# Patient Record
Sex: Female | Born: 1985 | Race: Black or African American | Hispanic: No | Marital: Single | Smoking: Former smoker
Health system: Southern US, Community
[De-identification: ages and names within clinical notes are randomized; demographics above are authoritative.]

## PROBLEM LIST (undated history)

## (undated) DIAGNOSIS — R1310 Dysphagia, unspecified: Secondary | ICD-10-CM

## (undated) DIAGNOSIS — Z923 Personal history of irradiation: Secondary | ICD-10-CM

## (undated) DIAGNOSIS — Z87898 Personal history of other specified conditions: Secondary | ICD-10-CM

## (undated) DIAGNOSIS — N390 Urinary tract infection, site not specified: Secondary | ICD-10-CM

## (undated) DIAGNOSIS — Z8489 Family history of other specified conditions: Secondary | ICD-10-CM

## (undated) DIAGNOSIS — R198 Other specified symptoms and signs involving the digestive system and abdomen: Secondary | ICD-10-CM

## (undated) DIAGNOSIS — L910 Hypertrophic scar: Secondary | ICD-10-CM

## (undated) DIAGNOSIS — L309 Dermatitis, unspecified: Secondary | ICD-10-CM

## (undated) HISTORY — PX: KELOID EXCISION: SHX1856

## (undated) HISTORY — DX: Personal history of irradiation: Z92.3

## (undated) SURGERY — EXCISION MASS
Anesthesia: General | Laterality: Bilateral

---
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
## 2006-01-05 ENCOUNTER — Emergency Department (HOSPITAL_COMMUNITY): Admission: EM | Admit: 2006-01-05 | Discharge: 2006-01-05 | Payer: Self-pay | Admitting: Emergency Medicine

## 2006-03-25 ENCOUNTER — Emergency Department (HOSPITAL_COMMUNITY): Admission: EM | Admit: 2006-03-25 | Discharge: 2006-03-25 | Payer: Self-pay | Admitting: Emergency Medicine

## 2006-08-14 ENCOUNTER — Emergency Department (HOSPITAL_COMMUNITY): Admission: EM | Admit: 2006-08-14 | Discharge: 2006-08-14 | Payer: Self-pay | Admitting: Emergency Medicine

## 2007-07-24 ENCOUNTER — Emergency Department (HOSPITAL_COMMUNITY): Admission: EM | Admit: 2007-07-24 | Discharge: 2007-07-24 | Payer: Self-pay | Admitting: Emergency Medicine

## 2007-08-08 ENCOUNTER — Encounter: Admission: RE | Admit: 2007-08-08 | Discharge: 2007-08-08 | Payer: Self-pay | Admitting: Internal Medicine

## 2008-01-09 ENCOUNTER — Ambulatory Visit (HOSPITAL_COMMUNITY): Admission: RE | Admit: 2008-01-09 | Discharge: 2008-01-09 | Payer: Self-pay | Admitting: Obstetrics & Gynecology

## 2008-10-20 ENCOUNTER — Emergency Department (HOSPITAL_COMMUNITY): Admission: EM | Admit: 2008-10-20 | Discharge: 2008-10-21 | Payer: Self-pay | Admitting: Emergency Medicine

## 2010-02-10 ENCOUNTER — Emergency Department (HOSPITAL_COMMUNITY): Admission: EM | Admit: 2010-02-10 | Discharge: 2010-02-10 | Payer: Self-pay | Admitting: Emergency Medicine

## 2010-11-09 LAB — URINALYSIS, ROUTINE W REFLEX MICROSCOPIC
Hgb urine dipstick: NEGATIVE
Ketones, ur: NEGATIVE mg/dL
Specific Gravity, Urine: 1.011 (ref 1.005–1.030)
Urobilinogen, UA: 0.2 mg/dL (ref 0.0–1.0)
pH: 6.5 (ref 5.0–8.0)

## 2010-11-09 LAB — POCT PREGNANCY, URINE: Preg Test, Ur: NEGATIVE

## 2010-11-25 ENCOUNTER — Inpatient Hospital Stay (HOSPITAL_COMMUNITY)
Admission: AD | Admit: 2010-11-25 | Discharge: 2010-11-25 | Disposition: A | Discharge status: Home / Self Care | Payer: Self-pay | Source: Ambulatory Visit | Attending: Family Medicine | Admitting: Family Medicine

## 2010-11-25 DIAGNOSIS — N390 Urinary tract infection, site not specified: Secondary | ICD-10-CM | POA: Insufficient documentation

## 2010-11-25 DIAGNOSIS — R300 Dysuria: Secondary | ICD-10-CM | POA: Insufficient documentation

## 2010-11-25 LAB — URINALYSIS, ROUTINE W REFLEX MICROSCOPIC
Bilirubin Urine: NEGATIVE
Glucose, UA: NEGATIVE mg/dL
Nitrite: POSITIVE — AB
Protein, ur: >300 mg/dL — AB
pH: 6.5 (ref 5.0–8.0)

## 2010-11-25 LAB — POCT PREGNANCY, URINE: Preg Test, Ur: NEGATIVE

## 2010-11-25 LAB — URINE MICROSCOPIC-ADD ON

## 2010-11-30 ENCOUNTER — Inpatient Hospital Stay (HOSPITAL_COMMUNITY)
Admission: AD | Admit: 2010-11-30 | Discharge: 2010-11-30 | Disposition: A | Discharge status: Home / Self Care | Payer: Self-pay | Source: Ambulatory Visit | Attending: Obstetrics & Gynecology | Admitting: Obstetrics & Gynecology

## 2010-11-30 DIAGNOSIS — N390 Urinary tract infection, site not specified: Secondary | ICD-10-CM | POA: Insufficient documentation

## 2010-11-30 LAB — DIFFERENTIAL
Eosinophils Absolute: 0 10*3/uL (ref 0.0–0.7)
Lymphocytes Relative: 11 % — ABNORMAL LOW (ref 12–46)
Lymphs Abs: 1.8 10*3/uL (ref 0.7–4.0)

## 2010-11-30 LAB — CBC
Hemoglobin: 11.4 g/dL — ABNORMAL LOW (ref 12.0–15.0)
MCH: 27.9 pg (ref 26.0–34.0)
MCHC: 31.8 g/dL (ref 30.0–36.0)
Platelets: 368 10*3/uL (ref 150–400)
RDW: 13.2 % (ref 11.5–15.5)
WBC: 16.1 10*3/uL — ABNORMAL HIGH (ref 4.0–10.5)

## 2010-11-30 LAB — URINE MICROSCOPIC-ADD ON

## 2010-11-30 LAB — URINALYSIS, ROUTINE W REFLEX MICROSCOPIC
Ketones, ur: NEGATIVE mg/dL
Protein, ur: 30 mg/dL — AB
Specific Gravity, Urine: 1.015 (ref 1.005–1.030)

## 2012-03-16 ENCOUNTER — Encounter (HOSPITAL_COMMUNITY): Payer: Self-pay | Admitting: Emergency Medicine

## 2012-03-16 ENCOUNTER — Emergency Department (HOSPITAL_COMMUNITY)
Admission: EM | Admit: 2012-03-16 | Discharge: 2012-03-16 | Disposition: A | Discharge status: Home / Self Care | Payer: BC Managed Care – PPO | Attending: Emergency Medicine | Admitting: Emergency Medicine

## 2012-03-16 DIAGNOSIS — L02213 Cutaneous abscess of chest wall: Secondary | ICD-10-CM

## 2012-03-16 DIAGNOSIS — N61 Mastitis without abscess: Secondary | ICD-10-CM | POA: Insufficient documentation

## 2012-03-16 DIAGNOSIS — L0291 Cutaneous abscess, unspecified: Secondary | ICD-10-CM

## 2012-03-16 DIAGNOSIS — N611 Abscess of the breast and nipple: Secondary | ICD-10-CM | POA: Insufficient documentation

## 2012-03-16 DIAGNOSIS — F17200 Nicotine dependence, unspecified, uncomplicated: Secondary | ICD-10-CM | POA: Insufficient documentation

## 2012-03-16 DIAGNOSIS — N3090 Cystitis, unspecified without hematuria: Secondary | ICD-10-CM | POA: Insufficient documentation

## 2012-03-16 MED ORDER — OXYCODONE-ACETAMINOPHEN 5-325 MG PO TABS: 1.0000 | ORAL_TABLET | Freq: Four times a day (QID) | ORAL | Status: AC | PRN

## 2012-03-16 MED ORDER — LIDOCAINE HCL 2 % IJ SOLN
15.0000 mL | Freq: Once | INTRAMUSCULAR | Status: AC
  Administered 2012-03-16: 300 mg

## 2012-03-16 MED ORDER — HYDROMORPHONE HCL PF 1 MG/ML IJ SOLN
1.0000 mg | Freq: Once | INTRAMUSCULAR | Status: AC
  Administered 2012-03-16: 1 mg via INTRAVENOUS
  Filled 2012-03-16: qty 1

## 2012-03-16 MED ORDER — CLINDAMYCIN HCL 150 MG PO CAPS: 300.0000 mg | ORAL_CAPSULE | Freq: Three times a day (TID) | ORAL | Status: AC

## 2012-03-16 MED ORDER — CLINDAMYCIN PHOSPHATE 600 MG/50ML IV SOLN
600.0000 mg | Freq: Once | INTRAVENOUS | Status: AC
  Administered 2012-03-16: 600 mg via INTRAVENOUS
  Filled 2012-03-16: qty 50

## 2012-03-16 NOTE — ED Notes (Signed)
Prescriptions x2 given with discharge instructions.  

## 2012-03-16 NOTE — ED Notes (Signed)
Patient complaining of a boil on her right breast/chest area that started a week ago; patient states that she frequently gets boils, but that they usually resolve on their own.  Patient states that the abscess has not stopped draining all day.

## 2012-03-16 NOTE — ED Notes (Signed)
The pt  Has had a boil on her chest since last Saturday and she has had pain intermittently.  The boil started draining this am and has drained all day.  The pain is increasing at present

## 2012-03-16 NOTE — ED Notes (Signed)
Suture cart at bedside for I&D 

## 2012-03-16 NOTE — ED Provider Notes (Signed)
History     CSN: [id number]  Arrival date & time 03/16/12  0102   First MD Initiated Contact with Patient 03/16/12 0143      Chief Complaint  Patient presents with  . Abscess    (Consider location/radiation/quality/duration/timing/severity/associated sxs/prior treatment) HPI Comments: Sue Bailey presents for evaluation of a cystic mass on her right chest/breast.  She states she developed a small "knot" about a week ago that has slowly increased in size.  She states she has had similar eruptions in the past that have drained without intervention and then self resolved.  This one has only gotten larger and now in the last 24 hours has developed redness and blistering of the overlying skin.  She denies any fevers or other current skin issues.  She reports there has been a small steady output of purulent material also over the last 24 hours.  Patient is a 26 y.o. female presenting with abscess. The history is provided by the patient. No language interpreter was used.  Abscess  This is a recurrent problem. The current episode started less than one week ago. The problem has been gradually worsening. The abscess is present on the torso. The problem is severe. The abscess is characterized by redness, painfulness, draining, swelling and blistering. Associated with: no known exposures. The abscess first occurred at home. Pertinent negatives include no anorexia, no decrease in physical activity, no fever, no diarrhea, no vomiting, no congestion, no rhinorrhea, no sore throat and no cough. There were no sick contacts. She has received no recent medical care.    Past Medical History  Diagnosis Date  . Abscess   . Bladder infection     Past Surgical History  Procedure Date  . Keloid removal     History reviewed. No pertinent family history.  History  Substance Use Topics  . Smoking status: Current Some Day Smoker  . Smokeless tobacco: Not on file  . Alcohol Use: Yes    OB History    Grav Para Term Preterm Abortions TAB SAB Ect Mult Living                  Review of Systems  Constitutional: Negative for fever, chills, diaphoresis, activity change and fatigue.  HENT: Negative for congestion, sore throat, facial swelling, rhinorrhea, neck pain and neck stiffness.   Eyes: Negative.   Respiratory: Negative for cough, choking, chest tightness and shortness of breath.   Cardiovascular: Negative for chest pain, palpitations and leg swelling.  Gastrointestinal: Negative for vomiting, abdominal pain, diarrhea, blood in stool, abdominal distention, anal bleeding and anorexia.  Genitourinary: Negative.   Musculoskeletal: Negative.   Skin: Positive for color change and wound. Negative for pallor.       Pain is localized to the area of swelling.  Pt reports a hx of keloid formation.  Neurological: Negative.   Hematological: Negative for adenopathy. Does not bruise/bleed easily.  Psychiatric/Behavioral: Negative.     Allergies  Review of patient's allergies indicates no known allergies.  Home Medications   Current Outpatient Rx  Name Route Sig Dispense Refill  . DIPHENHYDRAMINE HCL 25 MG PO TABS Oral Take 50 mg by mouth every 6 (six) hours as needed. For itching    . IBUPROFEN 200 MG PO TABS Oral Take 200 mg by mouth every 6 (six) hours as needed. For pain    . TRIAMCINOLONE ACETONIDE 0.1 % EX OINT Topical Apply 1 application topically 2 (two) times daily.      BP 110/70  Pulse 88  Temp 98.3 F (36.8 C) (Oral)  Resp 16  SpO2 98%  LMP 02/25/2012  Physical Exam  Constitutional: She is oriented to person, place, and time. She appears well-developed and well-nourished. No distress.  HENT:  Head: Normocephalic and atraumatic.  Right Ear: External ear normal.  Left Ear: External ear normal.  Nose: Nose normal.  Mouth/Throat: Oropharynx is clear and moist. No oropharyngeal exudate.  Eyes: Conjunctivae and EOM are normal. Pupils are equal, round, and reactive to light.  Right eye exhibits no discharge. Left eye exhibits no discharge. No scleral icterus.  Neck: Normal range of motion. Neck supple. No JVD present. No tracheal deviation present. No thyromegaly present.  Cardiovascular: Normal rate, regular rhythm, normal heart sounds and intact distal pulses.  Exam reveals no gallop and no friction rub.   No murmur heard. Pulmonary/Chest: Effort normal and breath sounds normal. No stridor. No respiratory distress. She has no wheezes. She has no rales. She exhibits mass, tenderness and swelling. She exhibits no bony tenderness, no laceration, no crepitus and no edema. Right breast exhibits mass, skin change and tenderness. Right breast exhibits no inverted nipple and no nipple discharge.    Abdominal: Soft. Bowel sounds are normal. She exhibits no distension and no mass. There is no tenderness. There is no rebound and no guarding.  Musculoskeletal: Normal range of motion. She exhibits no edema and no tenderness.  Lymphadenopathy:    She has no cervical adenopathy.  Neurological: She is alert and oriented to person, place, and time.  Skin: Skin is warm and dry. She is not diaphoretic. There is erythema. No pallor.  Psychiatric: She has a normal mood and affect. Her behavior is normal. Judgment and thought content normal.    ED Course  INCISION AND DRAINAGE Date/Time: 03/16/2012 4:32 AM Performed by: Sue Bailey Authorized by: Sue Bailey Consent: Verbal consent obtained. Written consent not obtained. Risks and benefits: risks, benefits and alternatives were discussed Consent given by: patient Patient understanding: patient states understanding of the procedure being performed Patient consent: the patient's understanding of the procedure matches consent given Procedure consent: procedure consent matches procedure scheduled Site marked: the operative site was marked Patient identity confirmed: verbally with patient and arm band Type: abscess Body  area: trunk (right lower chest wall/medial breast) Location details: right breast Anesthesia: local infiltration Local anesthetic: lidocaine 2% without epinephrine Patient sedated: no Scalpel size: 11 Needle gauge: 22 Incision type: single straight Complexity: complex Drainage: purulent and bloody Drainage amount: moderate Wound treatment: wound left open Packing material: none Patient tolerance: Patient tolerated the procedure well with no immediate complications. Comments: Performed I&D.  2.5cm incision made following the natural skin fold immediately inferior to the area of fluctuance.  Moderate amt of pus and bloody purulent debris expressed.  Blunt probe used to breakdown loculations.  Secondary deeper incision made through initial incision site and extended superiorly (lateral or parallel to the chest wall.  Again pocket probed to breakdown loculations and purulent drainage observed.  Note significant underlying induration even after completion of the procedure.   (including critical care time)  Labs Reviewed - No data to display No results found.   No diagnosis found.    MDM  Pt presents for evaluation of a chestwall/breast lesion that appears consistent with an abscess.  She appears nontoxic but has significant localized discomfort.  Concerned that she is at risk for a poor cosmetic outcome because of the lesions size, location, and her hx of keloid  formation.  Will place an IV, provide symptomatic care, and contact the on-call general surgeon prior to performing an I&D.  0435.  Pt is stable, NAD.  Procedure was well tolerated.  Pt advised to return to the emergency department in 24 hours for a wound check if the swelling does not start to improve or if it appears to expand.  She received IV clindamycin in the ER and will go home on PO clindamycin.  Discussed her presentation with Dr. Donell Beers.  She will see her in f/u.      Tobin Chad, MD 03/16/12 [phone]

## 2012-03-17 ENCOUNTER — Telehealth (INDEPENDENT_AMBULATORY_CARE_PROVIDER_SITE_OTHER): Payer: Self-pay

## 2012-03-17 ENCOUNTER — Emergency Department (INDEPENDENT_AMBULATORY_CARE_PROVIDER_SITE_OTHER)
Admission: EM | Admit: 2012-03-17 | Discharge: 2012-03-17 | Disposition: A | Discharge status: Home / Self Care | Payer: BC Managed Care – PPO | Source: Home / Self Care | Attending: Family Medicine | Admitting: Family Medicine

## 2012-03-17 ENCOUNTER — Encounter (HOSPITAL_COMMUNITY): Payer: Self-pay | Admitting: Emergency Medicine

## 2012-03-17 DIAGNOSIS — L02219 Cutaneous abscess of trunk, unspecified: Secondary | ICD-10-CM

## 2012-03-17 DIAGNOSIS — L02213 Cutaneous abscess of chest wall: Secondary | ICD-10-CM

## 2012-03-17 NOTE — ED Notes (Signed)
Seen in [facility] Saturday for abscess.  Returned to [facility] for follow up evaluation.  Reports less painful.

## 2012-03-17 NOTE — ED Provider Notes (Signed)
History     CSN: [id number]  Arrival date & time 03/17/12  1506   First MD Initiated Contact with Patient 03/17/12 1612      Chief Complaint  Patient presents with  . Follow-up    (Consider location/radiation/quality/duration/timing/severity/associated sxs/prior treatment) HPI Comments: 26 year old obese female nondiabetic smoker. Here for followup of an abscess in her right upper chest status post incision and drainage at [facility] yesterday. She received IV clindamycin and is currently taking clindamycin orally. Reports decreased pain and swelling. No drainage. Here for wound check and followup. Denies fever or chills. No wound cultures pending.    Past Medical History  Diagnosis Date  . Abscess   . Bladder infection     Past Surgical History  Procedure Date  . Keloid removal     No family history on file.  History  Substance Use Topics  . Smoking status: Current Some Day Smoker  . Smokeless tobacco: Not on file  . Alcohol Use: Yes    OB History    Grav Para Term Preterm Abortions TAB SAB Ect Mult Living                  Review of Systems  Constitutional: Negative for fever and chills.       10 systems reviewed and  pertinent negative and positive symptoms are as per HPI.     Skin:       As per HPI  All other systems reviewed and are negative.    Allergies  Review of patient's allergies indicates no known allergies.  Home Medications   Current Outpatient Rx  Name Route Sig Dispense Refill  . CLINDAMYCIN HCL 150 MG PO CAPS Oral Take 2 capsules (300 mg total) by mouth 3 (three) times daily. May dispense as 150mg  capsules 60 capsule 0  . DIPHENHYDRAMINE HCL 25 MG PO TABS Oral Take 50 mg by mouth every 6 (six) hours as needed. For itching    . IBUPROFEN 200 MG PO TABS Oral Take 200 mg by mouth every 6 (six) hours as needed. For pain    . OXYCODONE-ACETAMINOPHEN 5-325 MG PO TABS Oral Take 1 tablet by mouth every 6 (six) hours as needed for  pain. 16 tablet 0  . TRIAMCINOLONE ACETONIDE 0.1 % EX OINT Topical Apply 1 application topically 2 (two) times daily.      BP 105/72  Pulse 77  Temp 98 F (36.7 C) (Oral)  Resp 16  SpO2 99%  LMP 02/25/2012  Physical Exam  Nursing note and vitals reviewed. Constitutional: She is oriented to person, place, and time. She appears well-developed and well-nourished. No distress.  HENT:  Head: Normocephalic and atraumatic.  Neck: Neck supple.  Cardiovascular: Normal heart sounds.   Pulmonary/Chest: Breath sounds normal.  Lymphadenopathy:    She has no cervical adenopathy.  Neurological: She is alert and oriented to person, place, and time.  Skin:       Wound located in anterior chest medial and inferior to right beast. No significant erythema. Skin is peeling. Mild tenderness to palpation around wound. No fluctuation. There are 2 small incisions that appear to be close. With no drainage. No packing superior incision with small 5mm round lax fluctuant skin on top. Probed with an 18g needle after local infiltration and no purulent return. Impress early keloid formation.     ED Course  Procedures (including critical care time)  Labs Reviewed - No data to display No results found.   1. Abscess  of chest wall       MDM  Minimal expected discomfort with palpation, no active drainage. Incisions are closed there was an area where the skin appeared overstretched and fluctuant I probed with a 18 g needle after local infiltration with lidocaine no purulent return appears patient is forming a small keloid at the incision site. Asked to continue wound care as previously instructed and complete antibiotic therapy; return if recurrent pain, redness or drainage.     Sharin Grave, MD 03/18/12 [phone]

## 2012-03-17 NOTE — ED Notes (Signed)
Patient prefers not to put on gown

## 2012-03-17 NOTE — Telephone Encounter (Signed)
Patient was seen in ER for chest wall abscess and was to to follow up within 5 days with [facility]. No available spot. Please call patient with appointment.

## 2012-03-18 ENCOUNTER — Telehealth (INDEPENDENT_AMBULATORY_CARE_PROVIDER_SITE_OTHER): Payer: Self-pay

## 2012-03-18 NOTE — Telephone Encounter (Signed)
Notified pt of her f/u appt with Dr. Donell Beers on 03/24/12 at 9:45am.

## 2012-03-24 ENCOUNTER — Ambulatory Visit (INDEPENDENT_AMBULATORY_CARE_PROVIDER_SITE_OTHER): Payer: BC Managed Care – PPO | Admitting: General Surgery

## 2012-03-24 ENCOUNTER — Encounter (INDEPENDENT_AMBULATORY_CARE_PROVIDER_SITE_OTHER): Payer: Self-pay | Admitting: General Surgery

## 2012-03-24 VITALS — BP 130/80 | HR 60 | Temp 97.2°F | Resp 12 | Ht 66.0 in | Wt 245.8 lb

## 2012-03-24 DIAGNOSIS — N611 Abscess of the breast and nipple: Secondary | ICD-10-CM

## 2012-03-24 DIAGNOSIS — N61 Mastitis without abscess: Secondary | ICD-10-CM

## 2012-03-24 NOTE — Patient Instructions (Addendum)
Shower daily with dressing OFF.  Keep covered while draining.    Keep area clean.    Follow up in 2 weeks.

## 2012-03-24 NOTE — Progress Notes (Signed)
Chief Complaint  Patient presents with  . Routine Post Op    HISTORY: Pt is a 26 yo F with a 1.5 week history of a right medial breast abscess.  She was seen in the ED and had it lanced.  They did not pack it because it was too small.  She has been on clindamycin.  She is now having some soreness again.  It is no longer draining, but "has a head."  She is not having fever/ chills.    Past Medical History  Diagnosis Date  . Abscess   . Bladder infection     Past Surgical History  Procedure Date  . Keloid removal     Current Outpatient Prescriptions  Medication Sig Dispense Refill  . clindamycin (CLEOCIN) 150 MG capsule Take 2 capsules (300 mg total) by mouth 3 (three) times daily. May dispense as 150mg  capsules  60 capsule  0  . diphenhydrAMINE (BENADRYL) 25 MG tablet Take 50 mg by mouth every 6 (six) hours as needed. For itching      . ibuprofen (ADVIL,MOTRIN) 200 MG tablet Take 200 mg by mouth every 6 (six) hours as needed. For pain      . oxyCODONE-acetaminophen (PERCOCET) 5-325 MG per tablet Take 1 tablet by mouth every 6 (six) hours as needed for pain.  16 tablet  0  . triamcinolone ointment (KENALOG) 0.1 % Apply 1 application topically 2 (two) times daily.         Allergies  Allergen Reactions  . Penicillins Itching     Family History  Problem Relation Age of Onset  . Hyperlipidemia Father   . Cancer Paternal Grandmother     breast  . Diabetes Paternal Grandmother   . Cancer Paternal Grandfather     lung     History   Social History  . Marital Status: Single    Spouse Name: N/A    Number of Children: N/A  . Years of Education: N/A   Social History Main Topics  . Smoking status: Current Some Day Smoker  . Smokeless tobacco: Never Used  . Alcohol Use: Yes  . Drug Use: No  . Sexually Active: None    REVIEW OF SYSTEMS - PERTINENT POSITIVES ONLY: 12 point review of systems negative other than HPI and PMH except for headaches and easy bruising.    EXAM: Filed Vitals:   03/24/12 0956  BP: 130/80  Pulse: 60  Temp: 97.2 F (36.2 C)  Resp: 12    Gen:  No acute distress.  Well nourished and well groomed.   Neurological: Alert and oriented to person, place, and time. Coordination normal.  Head: Normocephalic and atraumatic.  Eyes: Conjunctivae are normal. Pupils are equal, round, and reactive to light. No scleral icterus.  Cardiovascular: Normal rate, regular rhythm. Respiratory: Effort normal.  No respiratory distress. Chest wall tenderness over medial right breast.  1 cm firm indurated area with boil on top.  This is anesthetized with 1% lidocaine and opened up.    GI: Soft. The abdomen is soft and nontender.  There is no rebound and no guarding.  Musculoskeletal: Normal range of motion. Extremities are nontender.  Lymphadenopathy: No cervical, preauricular, postauricular or axillary adenopathy is present Skin: Skin is warm and dry. No rash noted. No diaphoresis. No erythema. No pallor. No clubbing, cyanosis, or edema. Significant keloid formation over skin of chest and abdomen.    Psychiatric: Normal mood and affect. Behavior is normal. Judgment and thought content normal.  ASSESSMENT AND PLAN: right breast abscess Keep area clean.  Continue clindamycin.  Follow up in 2 weeks.  Too small to pack.       Maudry Diego MD Surgical Oncology, General and Endocrine Surgery [facility]      Visit Diagnoses: 1. right breast abscess     Primary Care Physician: Sheila Oats, MD

## 2012-03-24 NOTE — Assessment & Plan Note (Signed)
Keep area clean.  Continue clindamycin.  Follow up in 2 weeks.  Too small to pack.

## 2012-04-07 ENCOUNTER — Ambulatory Visit (INDEPENDENT_AMBULATORY_CARE_PROVIDER_SITE_OTHER): Payer: BC Managed Care – PPO | Admitting: General Surgery

## 2012-04-07 VITALS — BP 110/76 | HR 72 | Temp 97.6°F | Resp 16 | Ht 66.0 in | Wt 241.4 lb

## 2012-04-07 DIAGNOSIS — N611 Abscess of the breast and nipple: Secondary | ICD-10-CM

## 2012-04-07 DIAGNOSIS — N61 Mastitis without abscess: Secondary | ICD-10-CM

## 2012-04-07 MED ORDER — DOXYCYCLINE HYCLATE 100 MG PO TABS: 100.0000 mg | ORAL_TABLET | Freq: Two times a day (BID) | ORAL | Status: AC

## 2012-04-07 NOTE — Patient Instructions (Signed)
Get antibiotic and start if soreness, redness, or drainage starts again.  Follow up as needed.

## 2012-04-07 NOTE — Assessment & Plan Note (Signed)
Still minimally tender, but no evidence of cellulitis or abscess.  Follow up as needed. Give script for antibiotic to take if soreness starts before she can get appt.

## 2012-04-07 NOTE — Progress Notes (Signed)
HISTORY: Patient is a 26 year old female who is now approximately 2 weeks status post I&D of her right medial breast abscess. This was very small. She had it lanced in the emergency department, but it closed over and she developed a head at the top.  She is no longer having pain. It did close over again but she is not having soreness.  She denies fevers/ chills.    EXAM: General:  Alert and oriented Incision:  Already some keloid.  Tender only with deep palpation.      ASSESSMENT AND PLAN:   right breast abscess Still minimally tender, but no evidence of cellulitis or abscess.  Follow up as needed. Give script for antibiotic to take if soreness starts before she can get appt.        Maudry Diego, MD Surgical Oncology, General & Endocrine Surgery [facility]  DEFAULT,PROVIDER, MD Default, Provider, MD

## 2012-07-30 ENCOUNTER — Emergency Department (HOSPITAL_COMMUNITY)
Admission: EM | Admit: 2012-07-30 | Discharge: 2012-07-30 | Disposition: A | Discharge status: Home / Self Care | Payer: BC Managed Care – PPO | Attending: Emergency Medicine | Admitting: Emergency Medicine

## 2012-07-30 ENCOUNTER — Encounter (HOSPITAL_COMMUNITY): Payer: Self-pay | Admitting: *Deleted

## 2012-07-30 ENCOUNTER — Emergency Department (HOSPITAL_COMMUNITY): Payer: BC Managed Care – PPO

## 2012-07-30 DIAGNOSIS — Z79899 Other long term (current) drug therapy: Secondary | ICD-10-CM | POA: Insufficient documentation

## 2012-07-30 DIAGNOSIS — F17200 Nicotine dependence, unspecified, uncomplicated: Secondary | ICD-10-CM | POA: Insufficient documentation

## 2012-07-30 DIAGNOSIS — W010XXA Fall on same level from slipping, tripping and stumbling without subsequent striking against object, initial encounter: Secondary | ICD-10-CM | POA: Insufficient documentation

## 2012-07-30 DIAGNOSIS — Y939 Activity, unspecified: Secondary | ICD-10-CM | POA: Insufficient documentation

## 2012-07-30 DIAGNOSIS — S93609A Unspecified sprain of unspecified foot, initial encounter: Secondary | ICD-10-CM | POA: Insufficient documentation

## 2012-07-30 DIAGNOSIS — Y929 Unspecified place or not applicable: Secondary | ICD-10-CM | POA: Insufficient documentation

## 2012-07-30 MED ORDER — NAPROXEN 500 MG PO TABS: 500.0000 mg | ORAL_TABLET | Freq: Two times a day (BID) | ORAL | Status: DC

## 2012-07-30 MED ORDER — HYDROCODONE-ACETAMINOPHEN 5-325 MG PO TABS: 2.0000 | ORAL_TABLET | ORAL | Status: DC | PRN

## 2012-07-30 NOTE — ED Provider Notes (Signed)
History     CSN: [id number]  Arrival date & time 07/30/12  1252   First MD Initiated Contact with Patient 07/30/12 1423      Chief Complaint  Patient presents with  . Foot Injury    (Consider location/radiation/quality/duration/timing/severity/associated sxs/prior treatment) The history is provided by the patient and medical records.    Sue Bailey is a 27 y.o. female  with a hx of abscess and bladder infection presents to the Emergency Department complaining of acute, persistent, progressively worsening left foot pain onset yesterday afternoon.  Patient states she was walking a slit floor when she tripped and fell bending her foot backwards. She states that she does not have any pain in her ankle or knee however she does have pain in the top of her foot.  He states the pain is a bad that she is unable to walk. Associated symptoms include swelling, pain, gait disturbance.  Nothing makes it better and everything makes it worse.  Pt denies fever, chills, headache, neck pain, back pain, abdominal pain, chest pain, nausea, vomiting, diarrhea, hitting her head, loss of consciousness, numbness, tingling, weakness.     Past Medical History  Diagnosis Date  . Abscess   . Bladder infection     Past Surgical History  Procedure Date  . Keloid removal     Family History  Problem Relation Age of Onset  . Hyperlipidemia Father   . Cancer Paternal Grandmother     breast  . Diabetes Paternal Grandmother   . Cancer Paternal Grandfather     lung    History  Substance Use Topics  . Smoking status: Current Some Day Smoker  . Smokeless tobacco: Never Used  . Alcohol Use: Yes    OB History    Grav Para Term Preterm Abortions TAB SAB Ect Mult Living                  Review of Systems  HENT: Negative for neck pain and neck stiffness.   Respiratory: Negative for chest tightness.   Cardiovascular: Negative for chest pain.  Gastrointestinal: Negative for nausea, vomiting,  abdominal pain and diarrhea.  Musculoskeletal: Positive for joint swelling, arthralgias and gait problem. Negative for back pain.  Skin: Negative for wound.  Neurological: Negative for weakness, numbness and headaches.  All other systems reviewed and are negative.    Allergies  Penicillins  Home Medications   Current Outpatient Rx  Name  Route  Sig  Dispense  Refill  . DIPHENHYDRAMINE HCL 25 MG PO TABS   Oral   Take 50 mg by mouth every 6 (six) hours as needed. For itching         . HYDROCODONE-ACETAMINOPHEN 5-325 MG PO TABS   Oral   Take 2 tablets by mouth every 4 (four) hours as needed for pain.   6 tablet   0   . IBUPROFEN 200 MG PO TABS   Oral   Take 200 mg by mouth every 6 (six) hours as needed. For pain         . NAPROXEN 500 MG PO TABS   Oral   Take 1 tablet (500 mg total) by mouth 2 (two) times daily with a meal.   30 tablet   0   . TRIAMCINOLONE ACETONIDE 0.1 % EX OINT   Topical   Apply 1 application topically 2 (two) times daily.           BP 131/67  Pulse 95  Temp 98.1 F (36.7  C) (Oral)  Resp 16  SpO2 100%  LMP 07/14/2012  Physical Exam  Nursing note and vitals reviewed. Constitutional: She is oriented to person, place, and time. She appears well-developed and well-nourished. No distress.  HENT:  Head: Normocephalic and atraumatic.  Eyes: Conjunctivae normal are normal.  Neck: Normal range of motion and full passive range of motion without pain. No spinous process tenderness and no muscular tenderness present.       No tenderness to palpation of spinous processes or paraspinal muscles  Cardiovascular: Normal rate, regular rhythm, normal heart sounds and intact distal pulses.  Exam reveals no gallop and no friction rub.   No murmur heard.      Capillary refill less than 3 seconds  Pulmonary/Chest: Effort normal and breath sounds normal.  Musculoskeletal: She exhibits tenderness. She exhibits no edema.       ROM: Decreased range of motion  in the foot and ankle secondary to pain Minimal swelling noted top of the left foot, no ecchymosis noted No tenderness to palpation the spinous processes or paraspinal muscles of the T-spine and L-spine  Neurological: She is alert and oriented to person, place, and time. She exhibits normal muscle tone. Coordination normal.       Sensation to dull and sharp intact Strength 3/5 in the toes and ankle of the left foot secondary to pain and decreased effort on the patient's part, 5/5 of the left knee Unable to assess gait as patient refused to walk due to pain  Skin: Skin is warm and dry. She is not diaphoretic.  Psychiatric: She has a normal mood and affect.    ED Course  Procedures (including critical care time)  Labs Reviewed - No data to display Dg Foot Complete Left  07/30/2012  *RADIOLOGY REPORT*  Clinical Data: Foot injury.  Fell.  Pain.  LEFT FOOT - COMPLETE 3+ VIEW  Comparison: None.  Findings: There is no evidence for acute fracture or dislocation. No soft tissue foreign body or gas identified.  IMPRESSION: Negative exam.   Original Report Authenticated By: Norva Pavlov, M.D.      1. Foot sprain       MDM  Sue Bailey presents emergency department complaining of pain in the top of her foot began to fall.  Neurovascularly intact with good sensation and palpable pulses.  Exam limited by patient cooperation. Likely foot sprain, low suspicion for fracture.   X-ray without evidence of acute fracture subluxation or dislocation.  Will put patient in a postop shoe and given crutches for comfort. Will write for Vicodin for pain control and Naprosyn for anti-inflammatories. Discussed with the patient the need to rest, ice, compress and elevate. I've given her an orthopedic referral in case she does not improve in the next few days.  1. Medications: vicodin, naprosyn, usual home medications 2. Treatment: rest, drink plenty of fluids, RICE, use post-op shoe 3. Follow Up: Please  followup with your primary doctor for discussion of your diagnoses and further evaluation after today's visit; if you do not have a primary care doctor use the resource guide provided to find one; followup with Dr. Magnus Ivan if symptoms do not improve in 3-5 days         Dierdre Forth, PA-C 07/30/12 1453

## 2012-07-30 NOTE — Progress Notes (Signed)
Orthopedic Tech Progress Note Patient Details:  Sue Bailey [birth year] [id number] Post op shoe applied to Left LE with comfort to patient's liking. Crutches set to patient's height and comfort with use instructions.  Ortho Devices Type of Ortho Device: Crutches;Postop shoe/boot Ortho Device/Splint Location: Left LE Ortho Device/Splint Interventions: Application   Asia R Thompson 07/30/2012, 3:04 PM

## 2012-07-30 NOTE — ED Notes (Signed)
To ED for eval of left foot pain since falling last pm. Pulses palpable. Unable to bear weight due to pain.

## 2012-07-30 NOTE — ED Provider Notes (Signed)
Medical screening examination/treatment/procedure(s) were performed by non-physician practitioner and as supervising physician I was immediately available for consultation/collaboration.   Flint Melter, MD 07/30/12 [phone]

## 2014-02-07 ENCOUNTER — Emergency Department (HOSPITAL_COMMUNITY): Payer: BC Managed Care – PPO

## 2014-02-07 ENCOUNTER — Emergency Department (HOSPITAL_COMMUNITY)
Admission: EM | Admit: 2014-02-07 | Discharge: 2014-02-07 | Disposition: A | Discharge status: Home / Self Care | Payer: BC Managed Care – PPO | Attending: Emergency Medicine | Admitting: Emergency Medicine

## 2014-02-07 ENCOUNTER — Encounter (HOSPITAL_COMMUNITY): Payer: Self-pay | Admitting: Emergency Medicine

## 2014-02-07 ENCOUNTER — Other Ambulatory Visit: Payer: Self-pay

## 2014-02-07 DIAGNOSIS — Z87891 Personal history of nicotine dependence: Secondary | ICD-10-CM | POA: Insufficient documentation

## 2014-02-07 DIAGNOSIS — Z880 Allergy status to penicillin: Secondary | ICD-10-CM | POA: Insufficient documentation

## 2014-02-07 DIAGNOSIS — Y9229 Other specified public building as the place of occurrence of the external cause: Secondary | ICD-10-CM | POA: Insufficient documentation

## 2014-02-07 DIAGNOSIS — X30XXXA Exposure to excessive natural heat, initial encounter: Secondary | ICD-10-CM | POA: Insufficient documentation

## 2014-02-07 DIAGNOSIS — Y9389 Activity, other specified: Secondary | ICD-10-CM | POA: Insufficient documentation

## 2014-02-07 DIAGNOSIS — Z87448 Personal history of other diseases of urinary system: Secondary | ICD-10-CM | POA: Insufficient documentation

## 2014-02-07 DIAGNOSIS — Z3202 Encounter for pregnancy test, result negative: Secondary | ICD-10-CM | POA: Insufficient documentation

## 2014-02-07 DIAGNOSIS — Z91040 Latex allergy status: Secondary | ICD-10-CM | POA: Insufficient documentation

## 2014-02-07 DIAGNOSIS — T671XXA Heat syncope, initial encounter: Secondary | ICD-10-CM | POA: Insufficient documentation

## 2014-02-07 DIAGNOSIS — T671XXS Heat syncope, sequela: Secondary | ICD-10-CM

## 2014-02-07 DIAGNOSIS — Z872 Personal history of diseases of the skin and subcutaneous tissue: Secondary | ICD-10-CM | POA: Insufficient documentation

## 2014-02-07 LAB — BASIC METABOLIC PANEL
ANION GAP: 15 (ref 5–15)
BUN: 13 mg/dL (ref 6–23)
CALCIUM: 9.4 mg/dL (ref 8.4–10.5)
CHLORIDE: 97 meq/L (ref 96–112)
CO2: 24 mEq/L (ref 19–32)
CREATININE: 0.68 mg/dL (ref 0.50–1.10)
Glucose, Bld: 86 mg/dL (ref 70–99)
Potassium: 4.3 mEq/L (ref 3.7–5.3)
Sodium: 136 mEq/L — ABNORMAL LOW (ref 137–147)

## 2014-02-07 LAB — URINALYSIS, ROUTINE W REFLEX MICROSCOPIC
BILIRUBIN URINE: NEGATIVE
GLUCOSE, UA: NEGATIVE mg/dL
HGB URINE DIPSTICK: NEGATIVE
KETONES UR: NEGATIVE mg/dL
Leukocytes, UA: NEGATIVE
Nitrite: NEGATIVE
PROTEIN: NEGATIVE mg/dL
Specific Gravity, Urine: 1.018 (ref 1.005–1.030)
UROBILINOGEN UA: 1 mg/dL (ref 0.0–1.0)
pH: 5.5 (ref 5.0–8.0)

## 2014-02-07 LAB — CBC
HCT: 40.2 % (ref 36.0–46.0)
Hemoglobin: 12.9 g/dL (ref 12.0–15.0)
MCH: 27.9 pg (ref 26.0–34.0)
MCHC: 32.1 g/dL (ref 30.0–36.0)
MCV: 86.8 fL (ref 78.0–100.0)
PLATELETS: 450 10*3/uL — AB (ref 150–400)
RBC: 4.63 MIL/uL (ref 3.87–5.11)
RDW: 13.2 % (ref 11.5–15.5)
WBC: 13.5 10*3/uL — AB (ref 4.0–10.5)

## 2014-02-07 LAB — CBG MONITORING, ED
Glucose-Capillary: 66 mg/dL — ABNORMAL LOW (ref 70–99)
Glucose-Capillary: 75 mg/dL (ref 70–99)

## 2014-02-07 LAB — POC URINE PREG, ED: Preg Test, Ur: NEGATIVE

## 2014-02-07 NOTE — ED Provider Notes (Signed)
CSN: [id number]     Arrival date & time 02/07/14  1409 History   First MD Initiated Contact with Patient 02/07/14 1557     Chief Complaint  Patient presents with  . Loss of Consciousness     (Consider location/radiation/quality/duration/timing/severity/associated sxs/prior Treatment) HPI Comments: Pt states that she was at church today and got hot and went to the bathroom and he was found of the floor. Pt states that besides being tired she if feeling better at this time. Pt states that she had done this before. She states they have never found the reason for the symptoms. Pt states that she got dizzy before the episode started.  The history is provided by the patient. No language interpreter was used.    Past Medical History  Diagnosis Date  . Abscess   . Bladder infection    Past Surgical History  Procedure Laterality Date  . Keloid removal     Family History  Problem Relation Age of Onset  . Hyperlipidemia Father   . Cancer Paternal Grandmother     breast  . Diabetes Paternal Grandmother   . Cancer Paternal Grandfather     lung   History  Substance Use Topics  . Smoking status: Former Games developer  . Smokeless tobacco: Never Used  . Alcohol Use: Yes   OB History   Grav Para Term Preterm Abortions TAB SAB Ect Mult Living                 Review of Systems  Constitutional: Negative.   Respiratory: Negative.   Cardiovascular: Negative.   Neurological: Positive for dizziness.      Allergies  Bee venom; Penicillins; and Latex  Home Medications   Prior to Admission medications   Medication Sig Start Date End Date Taking? Authorizing Provider  clobetasol ointment (TEMOVATE) 0.05 % Apply 1 application topically 2 (two) times daily as needed (skin reactions).   Yes Historical Provider, MD  EPINEPHrine (EPIPEN) 0.3 mg/0.3 mL IJ SOAJ injection Inject 0.3 mg into the muscle as needed (allergic reaction).   Yes Historical Provider, MD  ibuprofen (ADVIL,MOTRIN) 800 MG  tablet Take 800 mg by mouth daily as needed for cramping.   Yes Historical Provider, MD   BP 110/69  Pulse 74  Temp(Src) 97.7 F (36.5 C)  Resp 18  Ht 5' 5.5" (1.664 m)  Wt 249 lb 4 oz (113.059 kg)  BMI 40.83 kg/m2  SpO2 100%  LMP 01/16/2014 Physical Exam  Nursing note and vitals reviewed. Constitutional: She is oriented to person, place, and time. She appears well-developed and well-nourished.  HENT:  Head: Normocephalic and atraumatic.  Cardiovascular: Normal rate and regular rhythm.   Pulmonary/Chest: Effort normal and breath sounds normal.  Abdominal: Soft. Bowel sounds are normal.  Musculoskeletal: Normal range of motion.       Cervical back: Normal.  Neurological: She is alert and oriented to person, place, and time. She exhibits normal muscle tone. Coordination normal.  Skin: Skin is warm and dry.    ED Course  Procedures (including critical care time) Labs Review Labs Reviewed  CBC - Abnormal; Notable for the following:    WBC 13.5 (*)    Platelets 450 (*)    All other components within normal limits  BASIC METABOLIC PANEL - Abnormal; Notable for the following:    Sodium 136 (*)    All other components within normal limits  CBG MONITORING, ED - Abnormal; Notable for the following:    Glucose-Capillary 66 (*)  All other components within normal limits  URINALYSIS, ROUTINE W REFLEX MICROSCOPIC  POC URINE PREG, ED  CBG MONITORING, ED    Imaging Review Dg Chest 2 View  02/07/2014   CLINICAL DATA:  Syncope.  EXAM: CHEST  2 VIEW  COMPARISON:  None.  FINDINGS: Heart size and mediastinal contours are within normal limits. Both lungs are clear. Visualized skeletal structures are unremarkable. Scoliosis is noted.  IMPRESSION: No acute disease.   Electronically Signed   By: Drusilla Kannerhomas  Dalessio M.D.   On: 02/07/2014 17:01     EKG Interpretation   Date/Time:  Sunday February 07 2014 14:19:44 EDT Ventricular Rate:  89 PR Interval:  134 QRS Duration: 74 QT Interval:   334 QTC Calculation: 406 R Axis:   35 Text Interpretation:  Normal sinus rhythm Normal ECG Confirmed by DELOS   MD, DOUGLAS ([id number]) on 02/07/2014 4:18:06 PM      MDM   Final diagnoses:  Heat syncope, sequela    Pt is ready to go home. No acute abnormality noted at this time. Pt can follow up with pcp    Teressa LowerVrinda Rocio Wolak, NP 02/07/14 1919

## 2014-02-07 NOTE — ED Notes (Signed)
Pt returned from scans. Pt monitored by 12-lead, bp cuff, and pulse ox. 

## 2014-02-07 NOTE — ED Notes (Signed)
Provided sandwich meal bag, graham crackers and a coke. Patient states she has not eaten today.

## 2014-02-07 NOTE — ED Notes (Signed)
CBG = 75. Nurse informed. 

## 2014-02-07 NOTE — ED Notes (Signed)
CBG = 66. Nurse informed.

## 2014-02-07 NOTE — ED Notes (Signed)
Patient transported to X-ray 

## 2014-02-07 NOTE — ED Provider Notes (Signed)
Medical screening examination/treatment/procedure(s) were performed by non-physician practitioner and as supervising physician I was immediately available for consultation/collaboration.   EKG Interpretation   Date/Time:  Sunday February 07 2014 14:19:44 EDT Ventricular Rate:  89 PR Interval:  134 QRS Duration: 74 QT Interval:  334 QTC Calculation: 406 R Axis:   35 Text Interpretation:  Normal sinus rhythm Normal ECG Confirmed by Malva CoganELOS   MD, Jaelah Hauth ([id number]) on 02/07/2014 4:18:06 PM       Geoffery Lyonsouglas Caitlyne Ingham, MD 02/07/14 [phone]

## 2014-02-07 NOTE — ED Notes (Signed)
Pt reports while at church today, members found her passed out on the bathroom floor. Pt reports that she felt herself get dizzy before passing out. Pt reports history of same, reports it happens when she gets too hot or too cold. Pt AAOx3 at present.

## 2014-07-30 HISTORY — PX: INCISION AND DRAINAGE ABSCESS: SHX5864

## 2016-03-28 ENCOUNTER — Ambulatory Visit: Payer: BLUE CROSS/BLUE SHIELD | Admitting: Radiation Oncology

## 2016-03-30 DIAGNOSIS — L910 Hypertrophic scar: Secondary | ICD-10-CM

## 2016-03-30 HISTORY — DX: Hypertrophic scar: L91.0
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
## 2016-04-03 ENCOUNTER — Institutional Professional Consult (permissible substitution): Payer: Self-pay | Admitting: Radiation Oncology

## 2016-04-03 ENCOUNTER — Ambulatory Visit
Admission: RE | Admit: 2016-04-03 | Discharge: 2016-04-03 | Disposition: A | Discharge status: Home / Self Care | Payer: BLUE CROSS/BLUE SHIELD | Source: Ambulatory Visit | Attending: Radiation Oncology | Admitting: Radiation Oncology

## 2016-04-03 ENCOUNTER — Encounter: Payer: Self-pay | Admitting: Radiation Oncology

## 2016-04-03 VITALS — BP 115/67 | HR 69 | Temp 98.2°F | Ht 65.5 in | Wt 242.1 lb

## 2016-04-03 DIAGNOSIS — Z510 Encounter for antineoplastic radiation therapy: Secondary | ICD-10-CM | POA: Insufficient documentation

## 2016-04-03 DIAGNOSIS — L910 Hypertrophic scar: Secondary | ICD-10-CM | POA: Insufficient documentation
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
## 2016-04-09 ENCOUNTER — Encounter (HOSPITAL_BASED_OUTPATIENT_CLINIC_OR_DEPARTMENT_OTHER): Payer: Self-pay | Admitting: *Deleted

## 2016-04-16 ENCOUNTER — Ambulatory Visit: Payer: BLUE CROSS/BLUE SHIELD | Admitting: Radiation Oncology

## 2016-04-16 ENCOUNTER — Ambulatory Visit (HOSPITAL_BASED_OUTPATIENT_CLINIC_OR_DEPARTMENT_OTHER): Admission: RE | Admit: 2016-04-16 | Payer: BLUE CROSS/BLUE SHIELD | Source: Ambulatory Visit | Admitting: Specialist

## 2016-04-16 HISTORY — DX: Other specified symptoms and signs involving the digestive system and abdomen: R19.8

## 2016-04-16 HISTORY — DX: Family history of other specified conditions: Z84.89

## 2016-04-16 HISTORY — DX: Personal history of other specified conditions: Z87.898

## 2016-04-16 HISTORY — DX: Urinary tract infection, site not specified: N39.0

## 2016-04-16 HISTORY — DX: Hypertrophic scar: L91.0

## 2016-04-16 HISTORY — DX: Dysphagia, unspecified: R13.10

## 2016-04-16 HISTORY — DX: Dermatitis, unspecified: L30.9

## 2016-04-16 SURGERY — EXCISION MASS
Anesthesia: General

## 2016-04-17 ENCOUNTER — Ambulatory Visit: Payer: BLUE CROSS/BLUE SHIELD | Admitting: Radiation Oncology

## 2016-04-18 ENCOUNTER — Ambulatory Visit: Payer: BLUE CROSS/BLUE SHIELD

## 2016-04-18 ENCOUNTER — Ambulatory Visit (HOSPITAL_BASED_OUTPATIENT_CLINIC_OR_DEPARTMENT_OTHER)
Admission: RE | Admit: 2016-04-18 | Discharge: 2016-04-18 | Disposition: A | Discharge status: Home / Self Care | Payer: BLUE CROSS/BLUE SHIELD | Source: Ambulatory Visit | Attending: Specialist | Admitting: Specialist

## 2016-04-18 ENCOUNTER — Ambulatory Visit: Payer: Self-pay | Admitting: Specialist

## 2016-04-18 ENCOUNTER — Encounter (HOSPITAL_BASED_OUTPATIENT_CLINIC_OR_DEPARTMENT_OTHER): Payer: Self-pay | Admitting: Anesthesiology

## 2016-04-18 ENCOUNTER — Ambulatory Visit (HOSPITAL_BASED_OUTPATIENT_CLINIC_OR_DEPARTMENT_OTHER): Payer: BLUE CROSS/BLUE SHIELD | Admitting: Anesthesiology

## 2016-04-18 ENCOUNTER — Encounter (HOSPITAL_BASED_OUTPATIENT_CLINIC_OR_DEPARTMENT_OTHER)
Admission: RE | Disposition: A | Discharge status: Home / Self Care | Payer: Self-pay | Source: Ambulatory Visit | Attending: Specialist

## 2016-04-18 ENCOUNTER — Ambulatory Visit
Admission: RE | Admit: 2016-04-18 | Discharge: 2016-04-18 | Disposition: A | Discharge status: Home / Self Care | Payer: BLUE CROSS/BLUE SHIELD | Source: Ambulatory Visit | Attending: Radiation Oncology | Admitting: Radiation Oncology

## 2016-04-18 DIAGNOSIS — L910 Hypertrophic scar: Secondary | ICD-10-CM

## 2016-04-18 DIAGNOSIS — Z510 Encounter for antineoplastic radiation therapy: Secondary | ICD-10-CM | POA: Diagnosis present

## 2016-04-18 DIAGNOSIS — Z87891 Personal history of nicotine dependence: Secondary | ICD-10-CM | POA: Diagnosis not present

## 2016-04-18 HISTORY — PX: MASS EXCISION: SHX2000

## 2016-04-18 SURGERY — EXCISION MASS
Anesthesia: General | Site: Back

## 2016-04-18 MED ORDER — MIDAZOLAM HCL 2 MG/2ML IJ SOLN
INTRAMUSCULAR | Status: AC
  Filled 2016-04-18: qty 2

## 2016-04-18 MED ORDER — SCOPOLAMINE 1 MG/3DAYS TD PT72: 1.0000 | MEDICATED_PATCH | Freq: Once | TRANSDERMAL | Status: DC | PRN

## 2016-04-18 MED ORDER — PROPOFOL 10 MG/ML IV BOLUS
INTRAVENOUS | Status: AC
  Filled 2016-04-18: qty 20

## 2016-04-18 MED ORDER — LIDOCAINE-EPINEPHRINE 1 %-1:100000 IJ SOLN
INTRAMUSCULAR | Status: AC
  Filled 2016-04-18: qty 1

## 2016-04-18 MED ORDER — CEFAZOLIN SODIUM-DEXTROSE 2-4 GM/100ML-% IV SOLN
INTRAVENOUS | Status: AC
  Filled 2016-04-18: qty 100

## 2016-04-18 MED ORDER — CHLORHEXIDINE GLUCONATE CLOTH 2 % EX PADS: 6.0000 | MEDICATED_PAD | Freq: Once | CUTANEOUS | Status: DC

## 2016-04-18 MED ORDER — ONDANSETRON HCL 4 MG/2ML IJ SOLN
INTRAMUSCULAR | Status: AC
  Filled 2016-04-18: qty 2

## 2016-04-18 MED ORDER — FENTANYL CITRATE (PF) 100 MCG/2ML IJ SOLN
INTRAMUSCULAR | Status: AC
  Filled 2016-04-18: qty 2

## 2016-04-18 MED ORDER — CEFAZOLIN SODIUM-DEXTROSE 2-4 GM/100ML-% IV SOLN: 2.0000 g | INTRAVENOUS | Status: DC

## 2016-04-18 MED ORDER — DEXAMETHASONE SODIUM PHOSPHATE 10 MG/ML IJ SOLN
INTRAMUSCULAR | Status: AC
  Filled 2016-04-18: qty 1

## 2016-04-18 MED ORDER — HYDROMORPHONE HCL 1 MG/ML IJ SOLN: 0.2500 mg | INTRAMUSCULAR | Status: DC | PRN

## 2016-04-18 MED ORDER — LIDOCAINE 2% (20 MG/ML) 5 ML SYRINGE
INTRAMUSCULAR | Status: AC
  Filled 2016-04-18: qty 5

## 2016-04-18 MED ORDER — GLYCOPYRROLATE 0.2 MG/ML IJ SOLN: 0.2000 mg | Freq: Once | INTRAMUSCULAR | Status: DC | PRN

## 2016-04-18 MED ORDER — ONDANSETRON HCL 4 MG/2ML IJ SOLN
INTRAMUSCULAR | Status: DC | PRN
  Administered 2016-04-18: 4 mg via INTRAVENOUS

## 2016-04-18 MED ORDER — MIDAZOLAM HCL 2 MG/2ML IJ SOLN: 1.0000 mg | INTRAMUSCULAR | Status: DC | PRN

## 2016-04-18 MED ORDER — SUCCINYLCHOLINE CHLORIDE 200 MG/10ML IV SOSY
PREFILLED_SYRINGE | INTRAVENOUS | Status: AC
  Filled 2016-04-18: qty 10

## 2016-04-18 MED ORDER — OXYCODONE HCL 5 MG PO TABS: 5.0000 mg | ORAL_TABLET | Freq: Once | ORAL | Status: DC | PRN

## 2016-04-18 MED ORDER — ONDANSETRON HCL 4 MG/2ML IJ SOLN: 4.0000 mg | Freq: Once | INTRAMUSCULAR | Status: DC | PRN

## 2016-04-18 MED ORDER — CEFAZOLIN SODIUM-DEXTROSE 2-3 GM-% IV SOLR
INTRAVENOUS | Status: DC | PRN
  Administered 2016-04-18: 2 g via INTRAVENOUS

## 2016-04-18 MED ORDER — OXYCODONE HCL 5 MG/5ML PO SOLN: 5.0000 mg | Freq: Once | ORAL | Status: DC | PRN

## 2016-04-18 MED ORDER — FENTANYL CITRATE (PF) 100 MCG/2ML IJ SOLN
INTRAMUSCULAR | Status: DC | PRN
  Administered 2016-04-18: 100 ug via INTRAVENOUS

## 2016-04-18 MED ORDER — MIDAZOLAM HCL 5 MG/5ML IJ SOLN
INTRAMUSCULAR | Status: DC | PRN
  Administered 2016-04-18: 2 mg via INTRAVENOUS

## 2016-04-18 MED ORDER — SUCCINYLCHOLINE CHLORIDE 20 MG/ML IJ SOLN
INTRAMUSCULAR | Status: DC | PRN
  Administered 2016-04-18: 50 mg via INTRAVENOUS

## 2016-04-18 MED ORDER — PROPOFOL 10 MG/ML IV BOLUS
INTRAVENOUS | Status: DC | PRN
  Administered 2016-04-18: 300 mg via INTRAVENOUS

## 2016-04-18 MED ORDER — DEXAMETHASONE SODIUM PHOSPHATE 4 MG/ML IJ SOLN
INTRAMUSCULAR | Status: DC | PRN
  Administered 2016-04-18: 10 mg via INTRAVENOUS

## 2016-04-18 MED ORDER — LIDOCAINE HCL (CARDIAC) 20 MG/ML IV SOLN
INTRAVENOUS | Status: DC | PRN
  Administered 2016-04-18: 30 mg via INTRAVENOUS

## 2016-04-18 MED ORDER — MEPERIDINE HCL 25 MG/ML IJ SOLN: 6.2500 mg | INTRAMUSCULAR | Status: DC | PRN

## 2016-04-18 MED ORDER — LACTATED RINGERS IV SOLN
INTRAVENOUS | Status: DC
  Administered 2016-04-18 (×2): via INTRAVENOUS

## 2016-04-18 MED ORDER — FENTANYL CITRATE (PF) 100 MCG/2ML IJ SOLN: 50.0000 ug | INTRAMUSCULAR | Status: DC | PRN

## 2016-04-18 MED ORDER — LIDOCAINE-EPINEPHRINE (PF) 1 %-1:200000 IJ SOLN
INTRAMUSCULAR | Status: DC | PRN
Start: 2016-04-18 — End: 2016-04-18
  Administered 2016-04-18: 20 mL

## 2016-04-18 SURGICAL SUPPLY — 68 items
APL SKNCLS STERI-STRIP NONHPOA (GAUZE/BANDAGES/DRESSINGS)
BALL CTTN LRG ABS STRL LF (GAUZE/BANDAGES/DRESSINGS)
BENZOIN TINCTURE PRP APPL 2/3 (GAUZE/BANDAGES/DRESSINGS) IMPLANT
BLADE HEX COATED 2.75 (ELECTRODE) IMPLANT
BLADE KNIFE PERSONA 10 (BLADE) ×3 IMPLANT
BLADE KNIFE PERSONA 15 (BLADE) ×3 IMPLANT
BNDG CMPR 9X4 STRL LF SNTH (GAUZE/BANDAGES/DRESSINGS) ×1
BNDG ESMARK 4X9 LF (GAUZE/BANDAGES/DRESSINGS) ×3 IMPLANT
CANISTER SUCT 1200ML W/VALVE (MISCELLANEOUS) IMPLANT
CLOSURE WOUND 1/2 X4 (GAUZE/BANDAGES/DRESSINGS)
COTTONBALL LRG STERILE PKG (GAUZE/BANDAGES/DRESSINGS) IMPLANT
COVER BACK TABLE 60X90IN (DRAPES) ×3 IMPLANT
COVER MAYO STAND STRL (DRAPES) ×3 IMPLANT
DRAPE LAPAROSCOPIC ABDOMINAL (DRAPES) IMPLANT
DRAPE U-SHAPE 76X120 STRL (DRAPES) IMPLANT
DRSG PAD ABDOMINAL 8X10 ST (GAUZE/BANDAGES/DRESSINGS) ×3 IMPLANT
ELECT NDL BLADE 2-5/6 (NEEDLE) IMPLANT
ELECT NEEDLE BLADE 2-5/6 (NEEDLE) IMPLANT
ELECT REM PT RETURN 9FT ADLT (ELECTROSURGICAL) ×3
ELECTRODE REM PT RTRN 9FT ADLT (ELECTROSURGICAL) ×1 IMPLANT
GAUZE SPONGE 4X4 12PLY STRL (GAUZE/BANDAGES/DRESSINGS) ×3 IMPLANT
GAUZE XEROFORM 1X8 LF (GAUZE/BANDAGES/DRESSINGS) ×1 IMPLANT
GAUZE XEROFORM 5X9 LF (GAUZE/BANDAGES/DRESSINGS) ×2 IMPLANT
GLOVE BIO SURGEON STRL SZ 6.5 (GLOVE) ×3 IMPLANT
GLOVE BIO SURGEONS STRL SZ 6.5 (GLOVE) ×2
GLOVE BIOGEL M STRL SZ7.5 (GLOVE) ×1 IMPLANT
GLOVE BIOGEL PI IND STRL 6.5 (GLOVE) IMPLANT
GLOVE BIOGEL PI IND STRL 8 (GLOVE) ×1 IMPLANT
GLOVE BIOGEL PI INDICATOR 6.5 (GLOVE) ×2
GLOVE BIOGEL PI INDICATOR 8 (GLOVE)
GLOVE ECLIPSE 7.0 STRL STRAW (GLOVE) ×3 IMPLANT
GOWN STRL REUS W/ TWL XL LVL3 (GOWN DISPOSABLE) ×2 IMPLANT
GOWN STRL REUS W/TWL XL LVL3 (GOWN DISPOSABLE) ×6
MARKER SKIN DUAL TIP RULER LAB (MISCELLANEOUS) ×3 IMPLANT
NDL HYPO 25X1 1.5 SAFETY (NEEDLE) ×1 IMPLANT
NDL SPNL 18GX3.5 QUINCKE PK (NEEDLE) IMPLANT
NEEDLE HYPO 25X1 1.5 SAFETY (NEEDLE) ×3 IMPLANT
NEEDLE SPNL 18GX3.5 QUINCKE PK (NEEDLE) IMPLANT
PACK BASIN DAY SURGERY FS (CUSTOM PROCEDURE TRAY) ×3 IMPLANT
PEN SKIN MARKING BROAD TIP (MISCELLANEOUS) ×3 IMPLANT
PENCIL BUTTON HOLSTER BLD 10FT (ELECTRODE) IMPLANT
SHEET MEDIUM DRAPE 40X70 STRL (DRAPES) ×3 IMPLANT
SHEETING SILICONE GEL EPI DERM (MISCELLANEOUS) IMPLANT
SLEEVE SCD COMPRESS KNEE MED (MISCELLANEOUS) ×3 IMPLANT
SPONGE GAUZE 4X4 12PLY STER LF (GAUZE/BANDAGES/DRESSINGS) IMPLANT
SPONGE LAP 18X18 X RAY DECT (DISPOSABLE) ×3 IMPLANT
STAPLER VISISTAT 35W (STAPLE) IMPLANT
STOCKINETTE 4X48 STRL (DRAPES) IMPLANT
STRIP CLOSURE SKIN 1/2X4 (GAUZE/BANDAGES/DRESSINGS) IMPLANT
SUCTION FRAZIER HANDLE 10FR (MISCELLANEOUS)
SUCTION TUBE FRAZIER 10FR DISP (MISCELLANEOUS) IMPLANT
SUT ETHILON 3 0 PS 1 (SUTURE) IMPLANT
SUT MNCRL AB 3-0 PS2 18 (SUTURE) ×4 IMPLANT
SUT MON AB 2-0 CT1 36 (SUTURE) IMPLANT
SUT PROLENE 4 0 P 3 18 (SUTURE) IMPLANT
SUT PROLENE 4 0 PS 2 18 (SUTURE) IMPLANT
SYR 20CC LL (SYRINGE) IMPLANT
SYR 50ML LL SCALE MARK (SYRINGE) IMPLANT
SYR CONTROL 10ML LL (SYRINGE) ×3 IMPLANT
TAPE HYPAFIX 6 X30' (GAUZE/BANDAGES/DRESSINGS)
TAPE HYPAFIX 6X30 (GAUZE/BANDAGES/DRESSINGS) IMPLANT
TOWEL OR 17X24 6PK STRL BLUE (TOWEL DISPOSABLE) ×6 IMPLANT
TRAY DSU PREP LF (CUSTOM PROCEDURE TRAY) ×3 IMPLANT
TUBE CONNECTING 20'X1/4 (TUBING)
TUBE CONNECTING 20X1/4 (TUBING) IMPLANT
UNDERPAD 30X30 (UNDERPADS AND DIAPERS) ×4 IMPLANT
VAC PENCILS W/TUBING CLEAR (MISCELLANEOUS) ×2 IMPLANT
YANKAUER SUCT BULB TIP NO VENT (SUCTIONS) ×3 IMPLANT

## 2016-04-18 NOTE — Transfer of Care (Signed)
Immediate Anesthesia Transfer of Care Note  Patient: Sue GutterMarleisha Freelove  Procedure(s) Performed: Procedure(s): EXCISION OF KELOIDS OF BACK X 2 (N/A)  Patient Location: PACU  Anesthesia Type:General  Level of Consciousness: awake and patient cooperative  Airway & Oxygen Therapy: Patient Spontanous Breathing and Patient connected to face mask oxygen  Post-op Assessment: Report given to RN and Post -op Vital signs reviewed and stable  Post vital signs: Reviewed and stable  Last Vitals:  Vitals:   04/18/16 1254 04/18/16 1434  BP: 114/72 109/78  Pulse: 70 (!) 102  Resp: 18   Temp: 36.8 C     Last Pain:  Vitals:   04/18/16 1254  TempSrc: Oral  PainSc: 8       Patients Stated Pain Goal: 3 (04/18/16 1254)  Complications: No apparent anesthesia complications

## 2016-04-18 NOTE — Brief Op Note (Signed)
04/18/2016  2:22 PM  PATIENT:  Sue Bailey  30 y.o. female  PRE-OPERATIVE DIAGNOSIS:  KELOIDOSIS  POST-OPERATIVE DIAGNOSIS:  KELOIDOSIS  PROCEDURE:  Procedure(s): EXCISION OF KELOIDS OF BACK X 2 (N/A)  SURGEON:  Surgeon(s) and Role:    * Louisa SecondGerald Aarian Cleaver, MD - Primary  PHYSICIAN ASSISTANT:   ASSISTANTS: none   ANESTHESIA:   general  EBL:  Total I/O In: 1000 [I.V.:1000] Out: -   BLOOD ADMINISTERED:none  DRAINS: none   LOCAL MEDICATIONS USED:  LIDOCAINE   SPECIMEN:  Excision  DISPOSITION OF SPECIMEN:  PATHOLOGY  COUNTS:  YES  TOURNIQUET:  * No tourniquets in log *  DICTATION: .Other Dictation: Dictation Number [id number]  PLAN OF CARE: Other Dictation: Dictation Number [id number]  PATIENT DISPOSITION:  PACU - hemodynamically stable.   Delay start of Pharmacological VTE agent (>24hrs) due to surgical blood loss or risk of bleeding: yes

## 2016-04-18 NOTE — Anesthesia Postprocedure Evaluation (Signed)
Anesthesia Post Note  Patient: Sue GutterMarleisha Lazalde  Procedure(s) Performed: Procedure(s) (LRB): EXCISION OF KELOIDS OF BACK X 2 (N/A)  Patient location during evaluation: PACU Anesthesia Type: MAC Level of consciousness: awake and alert Pain management: pain level controlled Vital Signs Assessment: post-procedure vital signs reviewed and stable Respiratory status: spontaneous breathing, nonlabored ventilation and respiratory function stable Cardiovascular status: stable and blood pressure returned to baseline Anesthetic complications: no    Last Vitals:  Vitals:   04/18/16 1500 04/18/16 1515  BP: 111/80 114/90  Pulse: 98 95  Resp: 16 19  Temp:      Last Pain:  Vitals:   04/18/16 1515  TempSrc:   PainSc: 0-No pain                 Linton RumpJennifer Dickerson Tami Blass

## 2016-04-18 NOTE — Anesthesia Preprocedure Evaluation (Signed)
Anesthesia Evaluation  Patient identified by MRN, date of birth, ID band Patient awake    Reviewed: Allergy & Precautions, NPO status , Patient's Chart, lab work & pertinent test results  Airway Mallampati: I  TM Distance: >3 FB Neck ROM: Full    Dental  (+) Teeth Intact, Dental Advisory Given   Pulmonary former smoker,  breath sounds clear to auscultation        Cardiovascular Rhythm:Regular Rate:Normal     Neuro/Psych    GI/Hepatic   Endo/Other    Renal/GU      Musculoskeletal   Abdominal   Peds  Hematology   Anesthesia Other Findings   Reproductive/Obstetrics                             Anesthesia Physical Anesthesia Plan  ASA: I  Anesthesia Plan: General   Post-op Pain Management:    Induction: Intravenous  Airway Management Planned: Oral ETT  Additional Equipment:   Intra-op Plan:   Post-operative Plan: Extubation in OR  Informed Consent: I have reviewed the patients History and Physical, chart, labs and discussed the procedure including the risks, benefits and alternatives for the proposed anesthesia with the patient or authorized representative who has indicated his/her understanding and acceptance.   Dental advisory given  Plan Discussed with: CRNA, Anesthesiologist and Surgeon  Anesthesia Plan Comments:         Anesthesia Quick Evaluation  

## 2016-04-18 NOTE — Progress Notes (Signed)
  Radiation Oncology         [phone] ________________________________  Name: Benita GutterMarleisha Russaw MRN: [id number]  Date: 04/18/2016  DOB: 1986/07/25  Electron beam Simulation  Note    ICD-9-CM ICD-10-CM   1. Keloid scar of skin 701.4 L91.0     Status: outpatient  NARRATIVE: The patient was brought to the treatment unit and placed in the planned treatment position. The clinical setup was verified. Patient then had set up of 2 custom electron cutout fields. first set up was directed at the right lateral chest region. Patient will be treated with custom electron cutout field using either 6 or 9 Mev electrons. Bolus will be used to improve the dose coverage along the scar. A second area was set up along the left lateral chest. The patient will had set up of a custom electron cutout field will be treated with 6 or 9 MeV electrons.  Plan: Patient will receive 3 radiation treatments to both surgical beds, 4.0 Gy per fraction for a cumulative postoperative dose of 12 gray. A special port plan is requested for treatment.  -----------------------------------  Billie LadeJames D. Juandiego Kolenovic, PhD, MD

## 2016-04-18 NOTE — Anesthesia Procedure Notes (Signed)
Procedure Name: Intubation Date/Time: 04/18/2016 1:32 PM Performed by: Genevieve NorlanderLINKA, Hermenia Fritcher L Pre-anesthesia Checklist: Patient identified, Emergency Drugs available, Suction available, Patient being monitored and Timeout performed Patient Re-evaluated:Patient Re-evaluated prior to inductionOxygen Delivery Method: Circle system utilized Preoxygenation: Pre-oxygenation with 100% oxygen Intubation Type: IV induction Ventilation: Mask ventilation without difficulty Laryngoscope Size: Miller and 3 Grade View: Grade II Tube type: Oral Number of attempts: 1 Airway Equipment and Method: Stylet and Oral airway Placement Confirmation: ETT inserted through vocal cords under direct vision,  positive ETCO2 and breath sounds checked- equal and bilateral Secured at: 21 cm Tube secured with: Tape Dental Injury: Teeth and Oropharynx as per pre-operative assessment

## 2016-04-18 NOTE — H&P (Signed)
Sue GutterMarleisha Garlitz is an 30 y.o. female.   Chief Complaint: Masses back x 2 HPI: Increased growyh and burning  Past Medical History:  Diagnosis Date  . Difficulty swallowing pills   . Eczema of hand    bilateral  . Family history of adverse reaction to anesthesia    states father gets a rash with anesthesia  . Frequent UTI   . History of syncope    states when she gets too cold or too hot  . Keloid 03/2016   one on back, one on left side - both at bra line    Past Surgical History:  Procedure Laterality Date  . INCISION AND DRAINAGE ABSCESS  2016   chest  . KELOID EXCISION Bilateral    earlobe - x 2    Family History  Problem Relation Age of Onset  . Myasthenia gravis Mother   . Hyperlipidemia Father   . Cancer Paternal Grandmother     breast  . Diabetes Paternal Grandmother   . Cancer Paternal Grandfather     lung   Social History:  reports that she quit smoking about 6 years ago. She has never used smokeless tobacco. She reports that she drinks alcohol. She reports that she does not use drugs.  Allergies:  Allergies  Allergen Reactions  . Bee Venom Anaphylaxis  . Chocolate Hives  . Citric Acid Hives  . Ciprofloxacin Hives  . Penicillins Other (See Comments)    FATHER IS ALLERGIC TO PCN     (Not in a hospital admission)  No results found for this or any previous visit (from the past 48 hour(s)). No results found.  Review of Systems  Constitutional: Negative.   HENT: Negative.   Eyes: Negative.   Respiratory: Negative.   Cardiovascular: Negative.   Gastrointestinal: Negative.   Genitourinary: Negative.   Musculoskeletal: Negative.   Skin: Positive for itching and rash.  Neurological: Negative.   Endo/Heme/Allergies: Negative.   Psychiatric/Behavioral: Negative.     Last menstrual period 03/20/2016. Physical Exam   Assessment/Plan Masses back x 2 for excision and plastic reconstruction  Jaiel Saraceno L, MD 04/18/2016, 12:40 PM

## 2016-04-18 NOTE — Discharge Instructions (Signed)
Activity (include date of return to work if known) °As tolerated: NO showers °NO driving °No heavy activities ° °Diet:regular No restrictions: ° °Wound Care: Keep dressing clean & dry ° °Do not change dressings °For Abdominoplasties wear abdominal binder °Special Instructions: °Do not raise arms up °Continue to empty, recharge, & record drainage from J-P drains &/or °Hemovacs 2-3 times a day, as needed. °Call Doctor if any unusual problems occur such as pain, excessive °Bleeding, unrelieved Nausea/vomiting, Fever &/or chills °When lying down, keep head elevated on 2-3 pillows or back-rest °For Addominoplasties the Jack-knife position °Follow-up appointment: Please call the office. ° °The patient received discharge instruction from:___________________________________________ ° ° °Patient signature ________________________________________ / Date___________ ° ° ° °Signature of individual providing instructions/ Date________________     ° ° °Post Anesthesia Home Care Instructions ° °Activity: °Get plenty of rest for the remainder of the day. A responsible adult should stay with you for 24 hours following the procedure.  °For the next 24 hours, DO NOT: °-Drive a car °-Operate machinery °-Drink alcoholic beverages °-Take any medication unless instructed by your physician °-Make any legal decisions or sign important papers. ° °Meals: °Start with liquid foods such as gelatin or soup. Progress to regular foods as tolerated. Avoid greasy, spicy, heavy foods. If nausea and/or vomiting occur, drink only clear liquids until the nausea and/or vomiting subsides. Call your physician if vomiting continues. ° °Special Instructions/Symptoms: °Your throat may feel dry or sore from the anesthesia or the breathing tube placed in your throat during surgery. If this causes discomfort, gargle with warm salt water. The discomfort should disappear within 24 hours. ° °If you had a scopolamine patch placed behind your ear for the management of  post- operative nausea and/or vomiting: ° °1. The medication in the patch is effective for 72 hours, after which it should be removed.  Wrap patch in a tissue and discard in the trash. Wash hands thoroughly with soap and water. °2. You may remove the patch earlier than 72 hours if you experience unpleasant side effects which may include dry mouth, dizziness or visual disturbances. °3. Avoid touching the patch. Wash your hands with soap and water after contact with the patch. °  °         °

## 2016-04-19 ENCOUNTER — Ambulatory Visit
Admission: RE | Admit: 2016-04-19 | Discharge: 2016-04-19 | Disposition: A | Discharge status: Home / Self Care | Payer: BLUE CROSS/BLUE SHIELD | Source: Ambulatory Visit | Attending: Radiation Oncology | Admitting: Radiation Oncology

## 2016-04-19 ENCOUNTER — Ambulatory Visit: Payer: BLUE CROSS/BLUE SHIELD

## 2016-04-19 ENCOUNTER — Encounter (HOSPITAL_BASED_OUTPATIENT_CLINIC_OR_DEPARTMENT_OTHER): Payer: Self-pay | Admitting: Specialist

## 2016-04-19 VITALS — BP 112/73 | HR 65 | Temp 98.4°F | Ht 65.0 in | Wt 248.9 lb

## 2016-04-19 DIAGNOSIS — Z510 Encounter for antineoplastic radiation therapy: Secondary | ICD-10-CM | POA: Diagnosis not present

## 2016-04-19 DIAGNOSIS — L910 Hypertrophic scar: Secondary | ICD-10-CM | POA: Diagnosis not present

## 2016-04-19 NOTE — Progress Notes (Signed)
  Radiation Oncology         [phone] ________________________________  Name: Benita GutterMarleisha Colston MRN: [id number]  Date: 04/19/2016  DOB: Jun 06, 1986  Electron Verification Simulation Note    ICD-9-CM ICD-10-CM   1. Keloid scar of skin 701.4 L91.0     Status: outpatient  NARRATIVE: The patient was brought to the treatment unit and placed in the planned treatment position. The clinical setup was verified.The custom electron cutout fields contoured the surgical bed accurately. The patient will proceed with her treatment -----------------------------------  Billie LadeJames D. Rivan Siordia, PhD, MD  This document serves as a record of services personally performed by Antony BlackbirdJames Caitrin Pendergraph, MD. It was created on his behalf by Eustace MooreJalaal Khan, a trained medical scribe. The creation of this record is based on the scribe's personal observations and the provider's statements to them. This document has been checked and approved by the attending provider.

## 2016-04-19 NOTE — Progress Notes (Signed)
Sue Bailey has completed 1 fractions to her right back and left lateral chest.  She reports having pain at a 2/10 more so in her left chest.  She is taking pain medication and is also taking an antibiotic.  The area on her right back has intact steri strips without any drainage.  The area on her left lateral chest has a small amount of bloody drainage with intact steri strips.  BP 112/73 (BP Location: Right Arm, Patient Position: Sitting)   Pulse 65   Temp 98.4 F (36.9 C) (Oral)   Ht 5\' 5"  (1.651 m)   Wt 248 lb 14.4 oz (112.9 kg)   LMP 04/15/2016   SpO2 100%   BMI 41.42 kg/m

## 2016-04-19 NOTE — Op Note (Signed)
NAME:  Cherrie DistanceWILLIAMS, Shantee          ACCOUNT NO.:  [id number]  MEDICAL RECORD NO.:  [id number]  LOCATION:                                 FACILITY:  PHYSICIAN:  Earvin HansenGerald L. Piotr Christopher, M.D.DATE OF BIRTH:  13-Dec-1985  DATE OF PROCEDURE:  04/18/2016 DATE OF DISCHARGE:                              OPERATIVE REPORT   A 30 year old lady with severe masses involving the right and left flank, back areas, increased growth discomfort, burning, itching.  PROCEDURES DONE:  Wide excision of plaster reconstruction of the areas. The masses measured approximately 10 x 6 cm and 5 x 3 cm.  ANESTHESIA:  General.  DESCRIPTION OF PROCEDURE:  The patient underwent general anesthesia and intubated orally.  She was then placed in prone position.  Prep was done to the back area using Betadine soap and solution, walled off with sterile towels and drapes so as to make a sterile field.  A 1.5% Xylocaine with epinephrine was injected locally, 100-200 concentration, total of 30 mL.  This was allowed to set up and then excision was made of the mass areas in an elliptical fashion using a 15-blade down the underlying deep subcutaneous tissue.  After proper hemostasis, the flaps were freed up significantly, right upper and lower, approximately 3.5 cm.  This allowed us to rotate the flaps together with two sutures of 3- 0 Monocryl subcutaneously and subdermally and then a running subcuticular stitch of 3-0 Monocryl.  Steri-Strips and soft dressing were applied to all the areas.  She withstood the procedures very well, was taken to recovery in excellent condition.  ESTIMATED BLOOD LOSS:  Less than 50 mL.     Yaakov GuthrieGerald L. Shon Houghruesdale, M.D.   ______________________________ Yaakov GuthrieGerald L. Shon Houghruesdale, M.D.    Cathie HoopsGLT/MEDQ  D:  04/18/2016  T:  04/19/2016  Job:  [id number]

## 2016-04-19 NOTE — Progress Notes (Signed)
Applied non adherant Telfa pads over patient's steri strips and secured with medipore tape.

## 2016-04-19 NOTE — Progress Notes (Signed)
  Radiation Oncology         [phone] ________________________________  Name: Sue Bailey MRN: [id number]  Date: 04/19/2016  DOB: 22-Jun-1986  Weekly Radiation Therapy Management    ICD-9-CM ICD-10-CM   1. Keloid scar of skin 701.4 L91.0      Current Dose: 4 Gy     Planned Dose:  12 Gy  Narrative . . . . . . . . The patient presents for routine under treatment assessment.                              The patient has completed 1 fraction to her right back and left lateral chest.  She reports pain as a 2/10 more so in her left chest.  She is taking pain medication and is also taking an antibiotic.  The area on her right back has intact steri strips without any drainage.  The area on her left lateral chest has a small amount of bloody drainage with intact steri strips. The patient had nausea earlier.                                 Set-up films were reviewed.                                 The chart was checked. Physical Findings. . .  height is 5\' 5"  (1.651 m) and weight is 248 lb 14.4 oz (112.9 kg). Her oral temperature is 98.4 F (36.9 C). Her blood pressure is 112/73 and her pulse is 65. Her oxygen saturation is 100%.   The surgical beds look clean without drainage or infection. Lungs are clear to auscultation bilaterally. Heart has regular rate and rhythm. Impression . . . . . . . The patient is tolerating radiation. Plan . . . . . . . . . . . . Continue treatment as planned. ________________________________   Billie LadeJames D. Yasaman Kolek, PhD, MD  This document serves as a record of services personally performed by Antony BlackbirdJames Lavette Yankovich, MD. It was created on his behalf by Eustace MooreJalaal Khan, a trained medical scribe. The creation of this record is based on the scribe's personal observations and the provider's statements to them. This document has been checked and approved by the attending provider.

## 2016-04-20 ENCOUNTER — Ambulatory Visit: Payer: BLUE CROSS/BLUE SHIELD | Admitting: Radiation Oncology

## 2016-04-20 ENCOUNTER — Emergency Department (HOSPITAL_COMMUNITY)
Admission: EM | Admit: 2016-04-20 | Discharge: 2016-04-20 | Disposition: A | Discharge status: Home / Self Care | Payer: BLUE CROSS/BLUE SHIELD | Attending: Emergency Medicine | Admitting: Emergency Medicine

## 2016-04-20 ENCOUNTER — Ambulatory Visit
Admission: RE | Admit: 2016-04-20 | Discharge: 2016-04-20 | Disposition: A | Discharge status: Home / Self Care | Payer: BLUE CROSS/BLUE SHIELD | Source: Ambulatory Visit | Attending: Radiation Oncology | Admitting: Radiation Oncology

## 2016-04-20 ENCOUNTER — Ambulatory Visit: Payer: Self-pay | Admitting: Specialist

## 2016-04-20 ENCOUNTER — Other Ambulatory Visit: Payer: Self-pay

## 2016-04-20 ENCOUNTER — Encounter (HOSPITAL_COMMUNITY): Payer: Self-pay | Admitting: Emergency Medicine

## 2016-04-20 DIAGNOSIS — Z87891 Personal history of nicotine dependence: Secondary | ICD-10-CM | POA: Insufficient documentation

## 2016-04-20 DIAGNOSIS — Z91040 Latex allergy status: Secondary | ICD-10-CM | POA: Diagnosis not present

## 2016-04-20 DIAGNOSIS — R55 Syncope and collapse: Secondary | ICD-10-CM

## 2016-04-20 DIAGNOSIS — Z510 Encounter for antineoplastic radiation therapy: Secondary | ICD-10-CM | POA: Diagnosis not present

## 2016-04-20 LAB — CBC
HEMATOCRIT: 41.7 % (ref 36.0–46.0)
HEMOGLOBIN: 12.7 g/dL (ref 12.0–15.0)
MCH: 27.6 pg (ref 26.0–34.0)
MCHC: 30.5 g/dL (ref 30.0–36.0)
MCV: 90.7 fL (ref 78.0–100.0)
Platelets: 451 10*3/uL — ABNORMAL HIGH (ref 150–400)
RBC: 4.6 MIL/uL (ref 3.87–5.11)
RDW: 13.2 % (ref 11.5–15.5)
WBC: 11.8 10*3/uL — ABNORMAL HIGH (ref 4.0–10.5)

## 2016-04-20 LAB — BASIC METABOLIC PANEL
ANION GAP: 6 (ref 5–15)
BUN: 10 mg/dL (ref 6–20)
CO2: 28 mmol/L (ref 22–32)
Calcium: 8.7 mg/dL — ABNORMAL LOW (ref 8.9–10.3)
Chloride: 103 mmol/L (ref 101–111)
Creatinine, Ser: 0.76 mg/dL (ref 0.44–1.00)
GFR calc Af Amer: >60 mL/min (ref >60)
GLUCOSE: 97 mg/dL (ref 65–99)
POTASSIUM: 3.7 mmol/L (ref 3.5–5.1)
Sodium: 137 mmol/L (ref 135–145)

## 2016-04-20 LAB — CBG MONITORING, ED: Glucose-Capillary: 81 mg/dL (ref 65–99)

## 2016-04-20 LAB — I-STAT BETA HCG BLOOD, ED (MC, WL, AP ONLY): I-stat hCG, quantitative: <5 m[IU]/mL (ref <5)

## 2016-04-20 MED ORDER — SODIUM CHLORIDE 0.9 % IV BOLUS (SEPSIS)
1000.0000 mL | Freq: Once | INTRAVENOUS | Status: AC
  Administered 2016-04-20: 1000 mL via INTRAVENOUS

## 2016-04-20 NOTE — ED Triage Notes (Signed)
Pt here via [facility] after syncopal episode at a friends house. Pt has hx of syncope when she gets hot or stressed. Pt had keloid removal and radiation today. Pt reports severe pain to surgical area. Pt was lowered to floor- did not fall. A/o x 4

## 2016-04-20 NOTE — ED Notes (Signed)
Pt received 50mcg fentanyl PTA. 

## 2016-04-20 NOTE — Progress Notes (Signed)
Patient's steri strips are intact without any drainage.  Applied non adherant telfa pad over incisions and secured with medipore tape.

## 2016-04-20 NOTE — ED Provider Notes (Signed)
[facility]-EMERGENCY DEPT Provider Note   CSN: [id number] Arrival date & time: 04/20/16  2044     History   Chief Complaint Chief Complaint  Patient presents with  . Loss of Consciousness    HPI Sue Bailey is a 30 y.o. female.   Loss of Consciousness   This is a recurrent problem. The current episode started less than 1 hour ago. The problem occurs rarely. The problem has been resolved. She lost consciousness for a period of less than one minute. Associated with: heat. Associated symptoms include dizziness. Pertinent negatives include abdominal pain, back pain, bladder incontinence, chest pain, diaphoresis, fever, focal weakness, nausea, palpitations, seizures, visual change and vomiting. She has tried nothing for the symptoms. Past medical history comments: Recent surgery.    Past Medical History:  Diagnosis Date  . Difficulty swallowing pills   . Eczema of hand    bilateral  . Family history of adverse reaction to anesthesia    states father gets a rash with anesthesia  . Frequent UTI   . History of syncope    states when she gets too cold or too hot  . Keloid 03/2016   one on back, one on left side - both at bra line    Patient Active Problem List   Diagnosis Date Noted  . Keloid scar of skin 04/03/2016  . right breast abscess   . Bladder infection     Past Surgical History:  Procedure Laterality Date  . INCISION AND DRAINAGE ABSCESS  2016   chest  . KELOID EXCISION Bilateral    earlobe - x 2  . MASS EXCISION N/A 04/18/2016   Procedure: EXCISION OF KELOIDS OF BACK X 2;  Surgeon: Louisa Second, MD;  Location: [facility];  Service: Plastics;  Laterality: N/A;    OB History    No data available       Home Medications    Prior to Admission medications   Medication Sig Start Date End Date Taking? Authorizing Provider  cephALEXin (KEFLEX) 500 MG capsule Take 500 mg by mouth every 6 (six) hours. 04/18/16  Yes Historical Provider, MD    diphenhydrAMINE (BENADRYL) 25 MG tablet Take 25 mg by mouth every 6 (six) hours as needed.   Yes Historical Provider, MD  ibuprofen (ADVIL,MOTRIN) 800 MG tablet Take 800 mg by mouth daily as needed for cramping.   Yes Historical Provider, MD  oxyCODONE-acetaminophen (PERCOCET) 10-325 MG tablet Take 1 tablet by mouth every 4 (four) hours as needed for pain. 04/18/16  Yes Historical Provider, MD  EPINEPHrine (EPIPEN) 0.3 mg/0.3 mL IJ SOAJ injection Inject 0.3 mg into the muscle as needed (allergic reaction).    Historical Provider, MD    Family History Family History  Problem Relation Age of Onset  . Myasthenia gravis Mother   . Hyperlipidemia Father   . Cancer Paternal Grandmother     breast  . Diabetes Paternal Grandmother   . Cancer Paternal Grandfather     lung    Social History Social History  Substance Use Topics  . Smoking status: Former Smoker    Quit date: 04/03/2010  . Smokeless tobacco: Never Used  . Alcohol use Yes     Comment: occasionally     Allergies   Bee venom; Chocolate; Citric acid; Latex; Ciprofloxacin; and Penicillins   Review of Systems Review of Systems  Constitutional: Negative for chills, diaphoresis and fever.  HENT: Negative for ear pain and sore throat.   Eyes: Negative for pain  and visual disturbance.  Respiratory: Negative for cough and shortness of breath.   Cardiovascular: Positive for syncope. Negative for chest pain and palpitations.  Gastrointestinal: Negative for abdominal pain, nausea and vomiting.  Genitourinary: Negative for bladder incontinence, dysuria and hematuria.  Musculoskeletal: Negative for arthralgias and back pain.  Skin: Negative for color change and rash.  Neurological: Positive for dizziness and syncope. Negative for focal weakness and seizures.  All other systems reviewed and are negative.    Physical Exam Updated Vital Signs BP 122/91 (BP Location: Right Arm)   Pulse 72   Temp 97.8 F (36.6 C) (Oral)   Resp 12    Ht 5\' 5"  (1.651 m)   Wt 110.2 kg   LMP 04/15/2016   SpO2 97%   BMI 40.44 kg/m   Physical Exam  Constitutional: She appears well-developed and well-nourished. No distress.  HENT:  Head: Normocephalic and atraumatic.  Eyes: Conjunctivae and EOM are normal.  Pupils are pinpoint  Neck: Neck supple.  Cardiovascular: Normal rate and regular rhythm.   No murmur heard. Pulmonary/Chest: Effort normal and breath sounds normal. No respiratory distress.  Abdominal: Soft. There is no tenderness.  Musculoskeletal: She exhibits no edema.  Neurological: She is alert. No cranial nerve deficit. She exhibits normal muscle tone. Coordination normal.  Skin: Skin is warm and dry.  Psychiatric: She has a normal mood and affect.  Nursing note and vitals reviewed.    ED Treatments / Results  Labs (all labs ordered are listed, but only abnormal results are displayed) Labs Reviewed  BASIC METABOLIC PANEL - Abnormal; Notable for the following:       Result Value   Calcium 8.7 (*)    All other components within normal limits  CBC - Abnormal; Notable for the following:    WBC 11.8 (*)    Platelets 451 (*)    All other components within normal limits  CBG MONITORING, ED  CBG MONITORING, ED  I-STAT BETA HCG BLOOD, ED ([facility], [facility], [facility] ONLY)    EKG  EKG Interpretation  Date/Time:  Friday April 20 2016 20:52:18 EDT Ventricular Rate:  64 PR Interval:    QRS Duration: 92 QT Interval:  399 QTC Calculation: 412 R Axis:   37 Text Interpretation:  Sinus rhythm No significant change since last tracing Confirmed by KNAPP  MD-J, JON ([id number]) on 04/20/2016 9:08:48 PM       Radiology No results found.  Procedures Procedures (including critical care time)  Medications Ordered in ED Medications  sodium chloride 0.9 % bolus 1,000 mL (0 mLs Intravenous Stopped 04/20/16 2324)     Initial Impression / Assessment and Plan / ED Course  I have reviewed the triage vital signs and the nursing  notes.  Pertinent labs & imaging results that were available during my care of the patient were reviewed by me and considered in my medical decision making (see chart for details).  Clinical Course  Comment By Time  Pt presented to the ED with syncope.  She felt flushed and hot before she feinted.  Pt has a history of similar episodes before.   Pt denies any chest pain or abdominal pain.    Linwood Dibbles, MD 09/22 2252    Ms. Stavros presents for syncope.  She has a history of syncope in the past, typically brought on by hot environments.  Today, she was having her hair done and was very hot.  She briefly lost consciousness but was cought as she fell and sustained no  injuries.  No tongus biting, loss of urine, or rhythmic movements. Doubt seizure.  Patient has no heart history.  Of note, the patient recently has surgery and is taking narcotic pain medications.  When EMS transported her, they gave additional fentanyl.  On arrival she has pinpoint pupils and is sleepy but arousable. Breathing is normal.  EKG obtained and is unremarkable. Pregnancy test is negative.  Patient likely had vasovagal syncope vs narcotic induced syncope.  Patient given NS bolus with improvement in symptoms.  Observed until she was fully back to baseline.  Patient is discharged with strict return precautions, follow up instructions to see her PCP, and educational materials.  Final Clinical Impressions(s) / ED Diagnoses   Final diagnoses:  Syncope, unspecified syncope type    New Prescriptions New Prescriptions   No medications on file     Garey HamMichael E Ethelda Deangelo, MD 04/21/16 1059    Linwood DibblesJon Knapp, MD 04/22/16 2032

## 2016-04-20 NOTE — ED Notes (Signed)
Pt ambulatory to restroom with steady gait.

## 2016-04-20 NOTE — H&P (Signed)
Sue Bailey is an 30 y.o. female.   Chief Complaint:Keloidosis back HPI: Increased growth pain and intense itching  Past Medical History:  Diagnosis Date  . Difficulty swallowing pills   . Eczema of hand    bilateral  . Family history of adverse reaction to anesthesia    states father gets a rash with anesthesia  . Frequent UTI   . History of syncope    states when she gets too cold or too hot  . Keloid 03/2016   one on back, one on left side - both at bra line    Past Surgical History:  Procedure Laterality Date  . INCISION AND DRAINAGE ABSCESS  2016   chest  . KELOID EXCISION Bilateral    earlobe - x 2  . MASS EXCISION N/A 04/18/2016   Procedure: EXCISION OF KELOIDS OF BACK X 2;  Surgeon: Louisa SecondGerald Memory Heinrichs, MD;  Location: [facility];  Service: Plastics;  Laterality: N/A;    Family History  Problem Relation Age of Onset  . Myasthenia gravis Mother   . Hyperlipidemia Father   . Cancer Paternal Grandmother     breast  . Diabetes Paternal Grandmother   . Cancer Paternal Grandfather     lung   Social History:  reports that she quit smoking about 6 years ago. She has never used smokeless tobacco. She reports that she drinks alcohol. She reports that she does not use drugs.  Allergies:  Allergies  Allergen Reactions  . Bee Venom Anaphylaxis  . Chocolate Hives  . Citric Acid Hives  . Latex   . Ciprofloxacin Hives  . Penicillins Other (See Comments)    FATHER IS ALLERGIC TO PCN     (Not in a hospital admission)  No results found for this or any previous visit (from the past 48 hour(s)). No results found.  Review of Systems  Constitutional: Negative.   HENT: Negative.   Eyes: Negative.   Respiratory: Negative.   Cardiovascular: Negative.   Skin: Positive for rash.  All other systems reviewed and are negative.   Last menstrual period 04/15/2016. Physical Exam   Assessment/Plan Severe keloidosis back x 2 for excision and plastic  closure and post op low dose irradiation  Sue Bailey L, MD 04/20/2016, 8:51 PM

## 2016-04-23 ENCOUNTER — Ambulatory Visit
Admission: RE | Admit: 2016-04-23 | Discharge: 2016-04-23 | Disposition: A | Discharge status: Home / Self Care | Payer: BLUE CROSS/BLUE SHIELD | Source: Ambulatory Visit | Attending: Radiation Oncology | Admitting: Radiation Oncology

## 2016-04-23 ENCOUNTER — Encounter: Payer: Self-pay | Admitting: Radiation Oncology

## 2016-04-23 VITALS — BP 107/70 | HR 70 | Temp 98.7°F | Resp 18

## 2016-04-23 DIAGNOSIS — Z510 Encounter for antineoplastic radiation therapy: Secondary | ICD-10-CM | POA: Diagnosis not present

## 2016-04-23 DIAGNOSIS — L910 Hypertrophic scar: Secondary | ICD-10-CM

## 2016-04-23 NOTE — Progress Notes (Addendum)
Sue GutterMarleisha Bailey has completed treatment with 3 fractions to her left side and back.  She reports having pain at a 3/10 and is taking percocet 1 tablet daily.  She also reports having some itching in the incision areas.  The incisions are intact with steri strips present.  She is wondering if she can leave them open to air.  She has been given a one month follow up appointment.  BP 107/70 (BP Location: Right Arm, Patient Position: Sitting, Cuff Size: Normal)   Pulse 70   Temp 98.7 F (37.1 C) (Oral)   Resp 18   LMP 04/15/2016

## 2016-04-23 NOTE — Progress Notes (Signed)
  Radiation Oncology         [phone] ________________________________  Name: Sue Bailey MRN: [id number]  Date: 04/23/2016  DOB: October 17, 1985  Weekly Radiation Therapy Management    ICD-9-CM ICD-10-CM   1. 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 . . . . . . . The patient presents for routine under treatment assessment.                                   The patient is Having some pain is taking one Percocet per day                                 Set-up films were reviewed.                                 The chart was checked. Physical Findings. . .  oral temperature is 98.7 F (37.1 C). Her blood pressure is 107/70 and her pulse is 70. Her respiration is 18. . Weight essentially stable.  No significant changes. Steri-Strips in place along the surgical beds. No signs of infection or drainage Impression . . . . . . . The patient is tolerating radiation. Plan . . . . . . . . . . . . Routine follow-up in one month  ________________________________   Billie LadeJames D. Jawan Chavarria, PhD, MD

## 2016-05-01 NOTE — Progress Notes (Signed)
  Radiation Oncology         [phone] ________________________________  Name: Sue Bailey MRN: [id number]  Date: 04/23/2016  DOB: 05/12/1986  End of Treatment Note  Diagnosis:   Keloids of the chest and back  Indication for treatment:  Prevent the chance of post-surgical recurrence     Radiation treatment dates:   04/19/2016-04/23/2016  Site/dose:   1) Right mid-back: 12 Gy in 3 fractions.   2) Left lateral chest: 12 Gy in 3 fractions.  Beams/energy:   1) En Face // 6MeV Electron   2) En Face // 6MeV Electron  Narrative: The patient tolerated radiation treatment relatively well. Main complaint was pain in the treatment areas from surgery. This was managed with Percocet.  Plan: The patient has completed radiation treatment. The patient will return to radiation oncology clinic for routine followup in one month. I advised them to call or return sooner if they have any questions or concerns related to their recovery or treatment.  -----------------------------------  Billie LadeJames D. Nhan Qualley, PhD, MD  This document serves as a record of services personally performed by Antony BlackbirdJames Dywane Peruski, MD. It was created on his behalf by Eustace MooreJalaal Khan, a trained medical scribe. The creation of this record is based on the scribe's personal observations and the provider's statements to them. This document has been checked and approved by the attending provider.

## 2016-05-31 ENCOUNTER — Inpatient Hospital Stay: Admission: RE | Admit: 2016-05-31 | Payer: Self-pay | Source: Ambulatory Visit | Admitting: Radiation Oncology

## 2016-07-04 ENCOUNTER — Encounter: Payer: Self-pay | Admitting: Oncology

## 2016-07-05 ENCOUNTER — Ambulatory Visit
Admission: RE | Admit: 2016-07-05 | Discharge: 2016-07-05 | Disposition: A | Discharge status: Home / Self Care | Payer: BLUE CROSS/BLUE SHIELD | Source: Ambulatory Visit | Attending: Radiation Oncology | Admitting: Radiation Oncology

## 2016-07-05 ENCOUNTER — Telehealth: Payer: Self-pay

## 2016-07-05 NOTE — Telephone Encounter (Signed)
I called the phone number in EPIC for Sue Bailey to inquire about her missed appointment today at 9:00. I was unable to leave a message to reschedule because her voice mail is full.

## 2017-04-23 ENCOUNTER — Ambulatory Visit
Admission: RE | Admit: 2017-04-23 | Discharge: 2017-04-23 | Disposition: A | Discharge status: Home / Self Care | Payer: BLUE CROSS/BLUE SHIELD | Source: Ambulatory Visit | Attending: Family | Admitting: Family

## 2017-04-23 ENCOUNTER — Other Ambulatory Visit: Payer: Self-pay | Admitting: Family

## 2017-04-23 DIAGNOSIS — M545 Low back pain, unspecified: Secondary | ICD-10-CM

## 2018-07-31 ENCOUNTER — Other Ambulatory Visit: Payer: Self-pay

## 2018-07-31 ENCOUNTER — Emergency Department (HOSPITAL_COMMUNITY): Payer: BLUE CROSS/BLUE SHIELD

## 2018-07-31 ENCOUNTER — Emergency Department (HOSPITAL_COMMUNITY)
Admission: EM | Admit: 2018-07-31 | Discharge: 2018-07-31 | Disposition: A | Discharge status: Home / Self Care | Payer: BLUE CROSS/BLUE SHIELD | Attending: Emergency Medicine | Admitting: Emergency Medicine

## 2018-07-31 ENCOUNTER — Encounter (HOSPITAL_COMMUNITY): Payer: Self-pay | Admitting: *Deleted

## 2018-07-31 DIAGNOSIS — Z87891 Personal history of nicotine dependence: Secondary | ICD-10-CM | POA: Diagnosis not present

## 2018-07-31 DIAGNOSIS — R0602 Shortness of breath: Secondary | ICD-10-CM | POA: Diagnosis present

## 2018-07-31 DIAGNOSIS — Z91040 Latex allergy status: Secondary | ICD-10-CM | POA: Diagnosis not present

## 2018-07-31 DIAGNOSIS — Z79899 Other long term (current) drug therapy: Secondary | ICD-10-CM | POA: Insufficient documentation

## 2018-07-31 MED ORDER — IPRATROPIUM-ALBUTEROL 0.5-2.5 (3) MG/3ML IN SOLN
3.0000 mL | Freq: Once | RESPIRATORY_TRACT | Status: AC
  Administered 2018-07-31: 3 mL via RESPIRATORY_TRACT
  Filled 2018-07-31: qty 3

## 2018-07-31 MED ORDER — FAMOTIDINE 20 MG PO TABS
40.0000 mg | ORAL_TABLET | Freq: Once | ORAL | Status: AC
  Administered 2018-07-31: 40 mg via ORAL
  Filled 2018-07-31: qty 2

## 2018-07-31 MED ORDER — DIPHENHYDRAMINE HCL 25 MG PO CAPS
25.0000 mg | ORAL_CAPSULE | Freq: Once | ORAL | Status: AC
  Administered 2018-07-31: 25 mg via ORAL
  Filled 2018-07-31: qty 1

## 2018-07-31 MED ORDER — DIPHENHYDRAMINE HCL 25 MG PO TABS: 25.0000 mg | ORAL_TABLET | Freq: Four times a day (QID) | ORAL | 0 refills | Status: AC | PRN

## 2018-07-31 MED ORDER — PREDNISONE 20 MG PO TABS
60.0000 mg | ORAL_TABLET | Freq: Once | ORAL | Status: AC
  Administered 2018-07-31: 60 mg via ORAL
  Filled 2018-07-31: qty 3

## 2018-07-31 MED ORDER — FAMOTIDINE 20 MG PO TABS: 20.0000 mg | ORAL_TABLET | Freq: Two times a day (BID) | ORAL | 0 refills | Status: AC | PRN

## 2018-07-31 MED ORDER — ALBUTEROL SULFATE HFA 108 (90 BASE) MCG/ACT IN AERS
1.0000 | INHALATION_SPRAY | Freq: Once | RESPIRATORY_TRACT | Status: AC
  Administered 2018-07-31: 1 via RESPIRATORY_TRACT
  Filled 2018-07-31: qty 6.7

## 2018-07-31 MED ORDER — PREDNISONE 10 MG PO TABS: 40.0000 mg | ORAL_TABLET | Freq: Every day | ORAL | 0 refills | Status: AC

## 2018-07-31 NOTE — ED Provider Notes (Signed)
[facility] EMERGENCY DEPARTMENT Provider Note   CSN: [id number] Arrival date & time: 07/31/18  1404     History   Chief Complaint Chief Complaint  Patient presents with  . Shortness of Breath    HPI Sue Bailey is a 33 y.o. female with history of eczema and difficulty swallowing pills presents for evaluation of acute onset, persistent shortness of breath for 5 days.  She reports that symptoms began 30 minutes after eating tomato.  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  This typically resolves after taking Benadryl for "a few days ".  She states that after eating tomato she began to feel short of breath and had an urticarial rash.  The rash lasted approximately 2 days and then resolved and has not returned.  She notes a "scratchy throat ", denies throat tightness.  She states that shortness of breath is constant, worsened acutely today after standing in front of the air fryer at work.  She notes a nonproductive cough.  Reports that she is tolerating p.o. food and fluids without difficulty.  No fevers or chills.  She is a non-smoker.  She has been taking Benadryl 3 times daily with improvement in her symptoms.   The history is provided by the patient.    Past Medical History:  Diagnosis Date  . Difficulty swallowing pills   . Eczema of hand    bilateral  . Family history of adverse reaction to anesthesia    states father gets a rash with anesthesia  . Frequent UTI   . History of radiation therapy 04/19/16-04/23/16   right mid-back 12 Gy, left lateral chest 12 Gy  . History of syncope    states when she gets too cold or too hot  . Keloid 03/2016   one on back, one on left side - both at bra line    Patient Active Problem List   Diagnosis Date Noted  . Keloid scar of skin 04/03/2016  . right breast abscess   . Bladder infection     Past Surgical History:  Procedure Laterality Date  .  INCISION AND DRAINAGE ABSCESS  2016   chest  . KELOID EXCISION Bilateral    earlobe - x 2  . MASS EXCISION N/A 04/18/2016   Procedure: EXCISION OF KELOIDS OF BACK X 2;  Surgeon: Louisa Second, MD;  Location: [facility];  Service: Plastics;  Laterality: N/A;     OB History   No obstetric history on file.      Home Medications    Prior to Admission medications   Medication Sig Start Date End Date Taking? Authorizing Provider  cephALEXin (KEFLEX) 500 MG capsule Take 500 mg by mouth every 6 (six) hours. 04/18/16   [provider]  diphenhydrAMINE (BENADRYL) 25 MG tablet Take 1 tablet (25 mg total) by mouth every 6 (six) hours as needed for itching or allergies. 07/31/18   Luevenia Maxin, Symone Cornman A, PA-C  EPINEPHrine (EPIPEN) 0.3 mg/0.3 mL IJ SOAJ injection Inject 0.3 mg into the muscle as needed (allergic reaction).    [provider]  famotidine (PEPCID) 20 MG tablet Take 1 tablet (20 mg total) by mouth 2 (two) times daily as needed (allergies). 07/31/18   Damarco Keysor A, PA-C  ibuprofen (ADVIL,MOTRIN) 800 MG tablet Take 800 mg by mouth daily as needed for cramping.    [provider]  oxyCODONE-acetaminophen (PERCOCET) 10-325 MG tablet  Take 1 tablet by mouth every 4 (four) hours as needed for pain. 04/18/16   [provider]  predniSONE (DELTASONE) 10 MG tablet Take 4 tablets (40 mg total) by mouth daily with breakfast for 4 days. 07/31/18 08/04/18  Jeanie SewerFawze, Kaion Tisdale A, PA-C    Family History Family History  Problem Relation Age of Onset  . Myasthenia gravis Mother   . Hyperlipidemia Father   . Cancer Paternal Grandmother        breast  . Diabetes Paternal Grandmother   . Cancer Paternal Grandfather        lung    Social History Social History   Tobacco Use  . Smoking status: Former Smoker    Last attempt to quit: 04/03/2010    Years since quitting: 8.3  . Smokeless tobacco: Never Used  Substance Use Topics  . Alcohol use: Yes    Comment:  occasionally  . Drug use: No     Allergies   Bee venom; Tomato; Chocolate; Citric acid; Ciprofloxacin; Latex; and Penicillins   Review of Systems Review of Systems  Constitutional: Negative for chills and fever.  HENT: Positive for sore throat. Negative for trouble swallowing and voice change.   Respiratory: Positive for cough and shortness of breath.   Cardiovascular: Negative for chest pain.  Gastrointestinal: Negative for abdominal pain, nausea and vomiting.  Skin: Positive for rash (resolved).  All other systems reviewed and are negative.    Physical Exam Updated Vital Signs BP 117/72   Pulse 72   Temp 97.6 F (36.4 C) (Oral)   Resp 16   Ht 5\' 6"  (1.676 m)   Wt 112.5 kg   LMP 07/31/2018 (Exact Date)   SpO2 100%   BMI 40.03 kg/m   Physical Exam Vitals signs and nursing note reviewed.  Constitutional:      General: She is not in acute distress.    Appearance: She is well-developed.  HENT:     Head: Normocephalic and atraumatic.     Jaw: No trismus.     Mouth/Throat:     Mouth: Mucous membranes are moist.     Tongue: Tongue does not protrude in midline.     Pharynx: Uvula midline. No oropharyngeal exudate or uvula swelling.     Tonsils: No tonsillar exudate or tonsillar abscesses. Swelling: 2+ on the right. 2+ on the left.     Comments: No angioedema or facial swelling noted.  Tolerating secretions without difficulty.  No abnormal phonation.  Posterior oropharynx without erythema, tonsillar hypertrophy, exudates, uvular deviation, or sublingual abnormalities. Eyes:     General:        Right eye: No discharge.        Left eye: No discharge.     Conjunctiva/sclera: Conjunctivae normal.  Neck:     Musculoskeletal: Normal range of motion and neck supple.     Vascular: No JVD.     Trachea: No tracheal deviation.     Comments: No upper airway stridor Cardiovascular:     Rate and Rhythm: Normal rate.  Pulmonary:     Effort: Pulmonary effort is normal. No  accessory muscle usage or respiratory distress.     Breath sounds: No stridor. Decreased breath sounds present. No wheezing.     Comments: Speaking in full sentences without difficulty Chest:     Chest wall: No tenderness.  Abdominal:     General: Bowel sounds are normal. There is no distension.     Palpations: Abdomen is soft.  Skin:  General: Skin is warm and dry.     Findings: No erythema or rash.  Neurological:     Mental Status: She is alert.  Psychiatric:        Behavior: Behavior normal.      ED Treatments / Results  Labs (all labs ordered are listed, but only abnormal results are displayed) Labs Reviewed - No data to display  EKG EKG Interpretation  Date/Time:  Thursday July 31 2018 14:11:54 EST Ventricular Rate:  83 PR Interval:  128 QRS Duration: 86 QT Interval:  358 QTC Calculation: 420 R Axis:   50 Text Interpretation:  Normal sinus rhythm with sinus arrhythmia Normal ECG similar to prior 9/17 Confirmed by Meridee ScoreButler, Michael [phone]([id number]) on 07/31/2018 4:12:12 PM   Radiology Dg Chest 2 View  Result Date: 07/31/2018 CLINICAL DATA:  Tomato allergy. Sleepy after contact with a tomato. Took Benadryl. EXAM: CHEST - 2 VIEW COMPARISON:  None. FINDINGS: The heart size and mediastinal contours are within normal limits. Both lungs are clear. The visualized skeletal structures are unremarkable. Slight apparent idiopathic scoliosis is stable. IMPRESSION: No active cardiopulmonary disease. Electronically Signed   By: Elsie StainJohn T Curnes M.D.   On: 07/31/2018 15:18    Procedures Procedures (including critical care time)  Medications Ordered in ED Medications  albuterol (PROVENTIL HFA;VENTOLIN HFA) 108 (90 Base) MCG/ACT inhaler 1 puff (has no administration in time range)  ipratropium-albuterol (DUONEB) 0.5-2.5 (3) MG/3ML nebulizer solution 3 mL (3 mLs Nebulization Given 07/31/18 1615)  famotidine (PEPCID) tablet 40 mg (40 mg Oral Given 07/31/18 1611)  diphenhydrAMINE (BENADRYL)  capsule 25 mg (25 mg Oral Given 07/31/18 1612)  predniSONE (DELTASONE) tablet 60 mg (60 mg Oral Given 07/31/18 1613)     Initial Impression / Assessment and Plan / ED Course  I have reviewed the triage vital signs and the nursing notes.  Pertinent labs & imaging results that were available during my care of the patient were reviewed by me and considered in my medical decision making (see chart for details).     Presenting for evaluation of shortness of breath for 5 days after eating tomato which she is allergic to.  No signs of anaphylaxis.  She is afebrile, initially somewhat tachypneic with improvement on reevaluation while in the ED.  Speaking in full sentences without difficulty.  No facial swelling or angioedema.  Has had symptoms that have been improving for the last 5 days.  Does not require epinephrine emergently.  Chest x-ray shows no acute cardiopulmonary abnormalities.  EKG shows normal sinus rhythm, no acute changes.  Doubt ACS/MI, PE, dissection, cardiac tamponade, esophageal rupture.  She received a breathing treatment, Pepcid, Benadryl, and prednisone and on reevaluation states she feels significantly better and feels comfortable with discharge home.  On reassessment, she is moving air more on auscultation of the lungs.  Will discharge with course of prednisone, Pepcid, Benadryl as needed.  Will discharge with albuterol inhaler as needed for shortness of breath.  Recommend follow-up with PCP or allergist for reevaluation.  Discussed strict ED return precautions.  Patient and father verbalized understanding of and agreement with plan and patient stable for discharge home at this time.  Final Clinical Impressions(s) / ED Diagnoses   Final diagnoses:  SOB (shortness of breath)    ED Discharge Orders         Ordered    predniSONE (DELTASONE) 10 MG tablet  Daily with breakfast     07/31/18 1711    famotidine (PEPCID) 20 MG tablet  2  times daily PRN     07/31/18 1711    diphenhydrAMINE  (BENADRYL) 25 MG tablet  Every 6 hours PRN     07/31/18 1711           Jeanie Sewer, PA-C 07/31/18 1714    Terrilee Files, MD 08/01/18 [phone]

## 2018-07-31 NOTE — ED Triage Notes (Signed)
Pt in c/o shortness of breath that started while she was at work, she is working at [employer] and states that there was a lot of smoke from the grill, pt arrives hyperventilating, HR 90 and O2 100% on arrival, pt calmed some at triage, denies recent cough, denies pain

## 2018-07-31 NOTE — ED Notes (Signed)
ED Provider at bedside. 

## 2018-07-31 NOTE — Discharge Instructions (Signed)
Start taking prednisone as prescribed beginning tomorrow.  You received the first dose in the emergency department today.  Take Benadryl every 6 hours as needed for shortness of breath and allergies.  You can take Pepcid twice daily as needed for this as well.  Use the inhaler as needed for shortness of breath 1 to 2 puffs every 4-6 hours.  Follow-up with PCP or allergist if symptoms persist.   Return to the emergency department if any concerning signs or symptoms develop such as persistent shortness of breath despite medications, chest pains, persistent vomiting, or high fevers.

## 2018-07-31 NOTE — ED Notes (Signed)
Pt reports allergic reaction to tomatoes 4 days ago. Pt states she has been taking benadryl at home but is still short of breath. Lung sounds clear, respirations slightly labored.

## 2020-12-20 IMAGING — DX DG CHEST 2V
2 series · 2 of 2 positions shown · non-contrast
Comparison: None.

CLINICAL DATA: Tomato allergy. Sleepy after contact with a tomato.
Took Benadryl.

EXAM:
CHEST - 2 VIEW

[w chest pa]
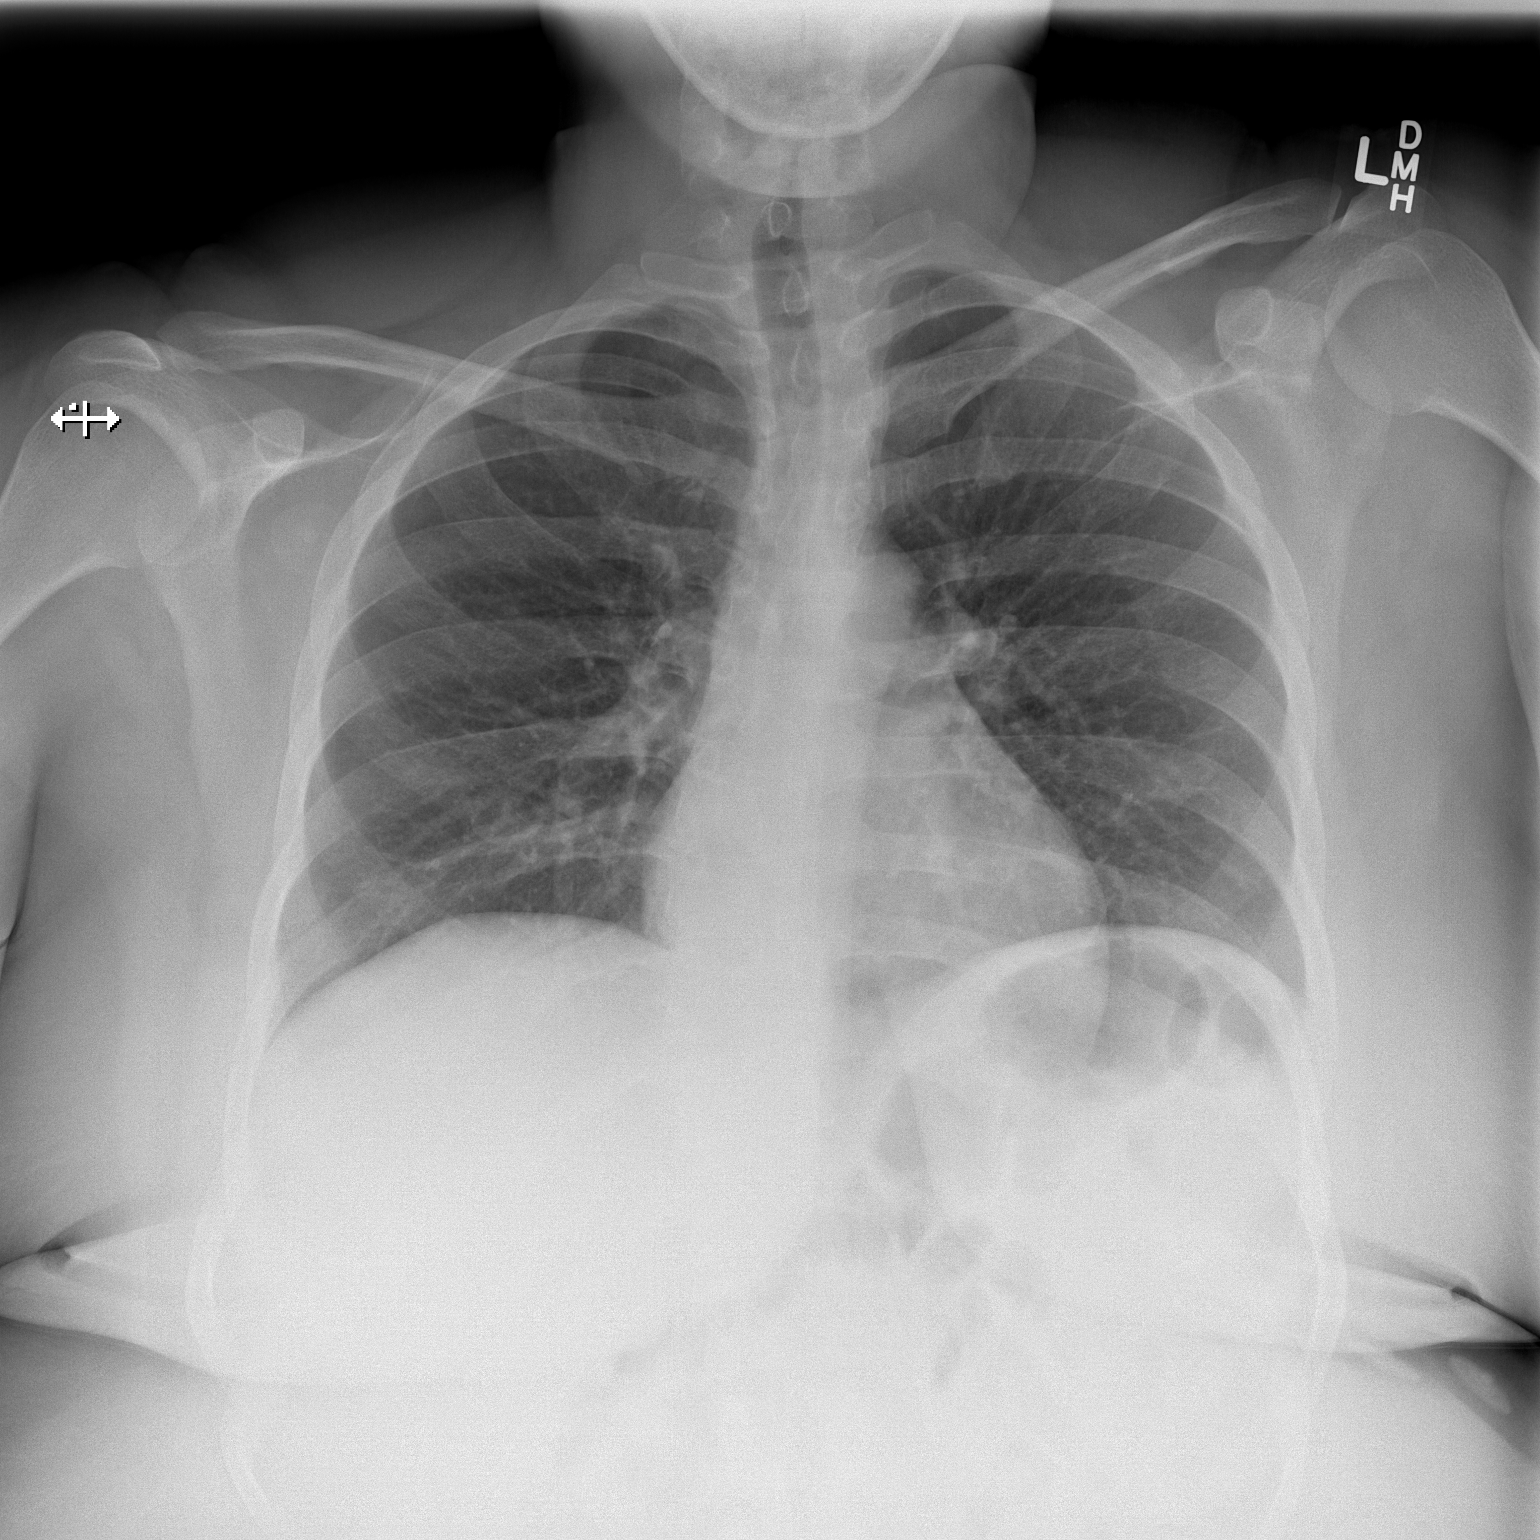

[w chest lat]
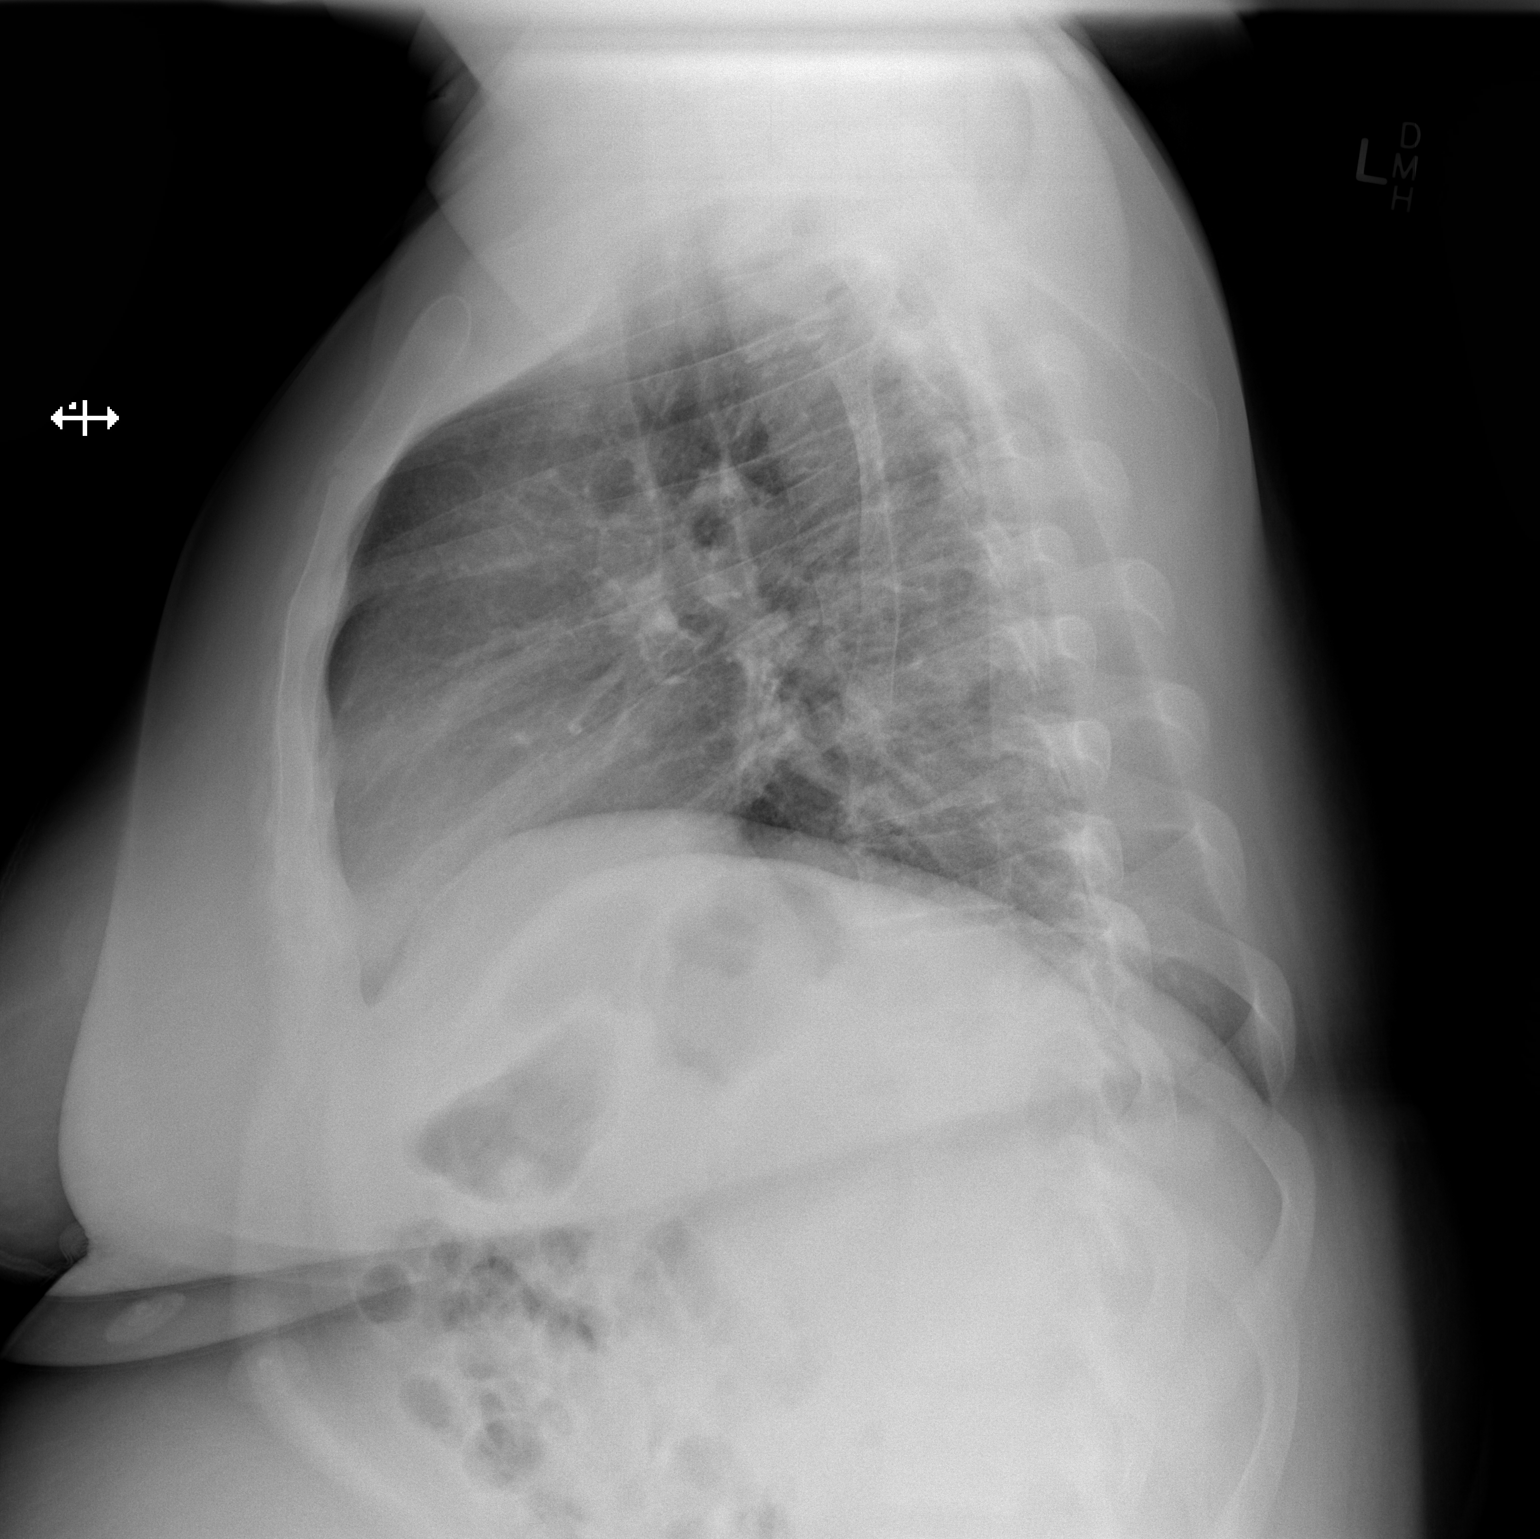

[2 of 2 positions shown; findings below may reference images not displayed]

FINDINGS: The heart size and mediastinal contours are within normal limits.
Both lungs are clear. The visualized skeletal structures are
unremarkable. Slight apparent idiopathic scoliosis is stable.
IMPRESSION: No active cardiopulmonary disease.

## 2022-09-08 ENCOUNTER — Ambulatory Visit (HOSPITAL_COMMUNITY)
Admission: EM | Admit: 2022-09-08 | Discharge: 2022-09-08 | Disposition: A | Discharge status: Home / Self Care | Payer: BLUE CROSS/BLUE SHIELD | Attending: Physician Assistant | Admitting: Physician Assistant

## 2022-09-08 ENCOUNTER — Other Ambulatory Visit: Payer: Self-pay

## 2022-09-08 ENCOUNTER — Encounter (HOSPITAL_COMMUNITY): Payer: Self-pay | Admitting: Emergency Medicine

## 2022-09-08 DIAGNOSIS — L03113 Cellulitis of right upper limb: Secondary | ICD-10-CM

## 2022-09-08 DIAGNOSIS — L03114 Cellulitis of left upper limb: Secondary | ICD-10-CM

## 2022-09-08 DIAGNOSIS — L309 Dermatitis, unspecified: Secondary | ICD-10-CM

## 2022-09-08 DIAGNOSIS — L03119 Cellulitis of unspecified part of limb: Secondary | ICD-10-CM

## 2022-09-08 MED ORDER — DOXYCYCLINE HYCLATE 100 MG PO CAPS: 100.0000 mg | ORAL_CAPSULE | Freq: Two times a day (BID) | ORAL | 0 refills | Status: AC

## 2022-09-08 MED ORDER — FLUOCINONIDE 0.05 % EX OINT: 1.0000 | TOPICAL_OINTMENT | Freq: Two times a day (BID) | CUTANEOUS | 1 refills | Status: AC

## 2022-09-08 NOTE — ED Triage Notes (Signed)
Patient has eczema.  Since Thursday night/Friday morning hands have been oozing and last night worsened.  Wearing gloves to keep wounds from sticking to each other   There are areas on both hands that look like bumps with pus .

## 2022-09-08 NOTE — Discharge Instructions (Signed)
Very good to meet you today.  You may have an infection of your chronic eczema.  I would like to treat with doxycycline as directed.  You may use the Lidex ointment to help with the pain and dryness of your eczema.  I would like for you to follow-up with the dermatology specialist -please call for an appointment.  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  If worsening symptoms, please follow-up with your PCP or back to the urgent care and we're happy to see you again.

## 2022-09-08 NOTE — ED Provider Notes (Signed)
[place]    CSN: [id number] Arrival date & time: 09/08/22  1646      History   Chief Complaint Chief Complaint  Patient presents with   Wound Infection    HPI Sue Bailey is a 37 y.o. female with a history of chronic eczema of her hands.  She was previously treated by dermatology with injections, but had to stop this due to COVID-19 in 2020.  She has been using some ointment at home, but this has not helped her eczema.  In the last few days, she started to develop very painful lesions, which are starting to leak some pus.  Denies any new lotions or fragrances.  No other new materials that she has been using.  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  No fever or chills.   Past Medical History:  Diagnosis Date   Difficulty swallowing pills    Eczema of hand    bilateral   Family history of adverse reaction to anesthesia    states father gets a rash with anesthesia   Frequent UTI    History of radiation therapy 04/19/16-04/23/16   right mid-back 12 Gy, left lateral chest 12 Gy   History of syncope    states when she gets too cold or too hot   Keloid 03/2016   one on back, one on left side - both at bra line    Patient Active Problem List   Diagnosis Date Noted   Keloid scar of skin 04/03/2016   right breast abscess    Bladder infection     Past Surgical History:  Procedure Laterality Date   INCISION AND DRAINAGE ABSCESS  2016   chest   KELOID EXCISION Bilateral    earlobe - x 2   MASS EXCISION N/A 04/18/2016   Procedure: EXCISION OF KELOIDS OF BACK X 2;  Surgeon: Cristine Polio, MD;  Location: [facility];  Service: Plastics;  Laterality: N/A;    OB History   No obstetric history on file.      Home Medications    Prior to Admission medications   Medication Sig Start Date End Date Taking? Authorizing Provider  doxycycline (VIBRAMYCIN) 100 MG capsule Take 1 capsule (100 mg total) by mouth 2 (two) times daily. 09/08/22   Yes Lesette Frary M, PA-C  fluocinonide ointment (LIDEX) [id number] % Apply 1 Application topically 2 (two) times daily. 09/08/22  Yes Alva Kuenzel M, PA-C  cephALEXin (KEFLEX) 500 MG capsule Take 500 mg by mouth every 6 (six) hours. Patient not taking: Reported on 09/08/2022 04/18/16   [provider]  diphenhydrAMINE (BENADRYL) 25 MG tablet Take 1 tablet (25 mg total) by mouth every 6 (six) hours as needed for itching or allergies. 07/31/18   Nils Flack, Mina A, PA-C  EPINEPHrine (EPIPEN) 0.3 mg/0.3 mL IJ SOAJ injection Inject 0.3 mg into the muscle as needed (allergic reaction).    [provider]  famotidine (PEPCID) 20 MG tablet Take 1 tablet (20 mg total) by mouth 2 (two) times daily as needed (allergies). Patient not taking: Reported on 09/08/2022 07/31/18   Rodell Perna A, PA-C  ibuprofen (ADVIL,MOTRIN) 800 MG tablet Take 800 mg by mouth daily as needed for cramping.    [provider]  oxyCODONE-acetaminophen (PERCOCET) 10-325 MG tablet Take 1 tablet by mouth every 4 (four) hours as needed for pain. Patient not taking: Reported on 09/08/2022 04/18/16   [provider]    Family History Family History  Problem Relation Age of Onset   Myasthenia gravis Mother    Hyperlipidemia Father    Cancer Paternal Grandmother        breast   Diabetes Paternal Grandmother    Cancer Paternal Grandfather        lung    Social History Social History   Tobacco Use   Smoking status: Former    Types: Cigarettes    Quit date: 04/03/2010    Years since quitting: 12.4   Smokeless tobacco: Never  Vaping Use   Vaping Use: Never used  Substance Use Topics   Alcohol use: Yes    Comment: occasionally   Drug use: No     Allergies   Bee venom, Tomato, Chocolate, Citric acid, Ciprofloxacin, Latex, and Penicillins   Review of Systems Review of Systems   Physical Exam Triage Vital Signs ED Triage Vitals  Enc Vitals Group     BP 09/08/22 1806 107/72     Pulse Rate  09/08/22 1806 (!) 103     Resp 09/08/22 1806 20     Temp 09/08/22 1806 99.3 F (37.4 C)     Temp Source 09/08/22 1806 Oral     SpO2 09/08/22 1806 99 %     Weight --      Height --      Head Circumference --      Peak Flow --      Pain Score 09/08/22 1803 9     Pain Loc --      Pain Edu? --      Excl. in [place]? --    No data found.  Updated Vital Signs BP 107/72 (BP Location: Right Arm) Comment (BP Location): large cuff  Pulse (!) 103   Temp 99.3 F (37.4 C) (Oral)   Resp 20   LMP 08/17/2022   SpO2 99%     Physical Exam Vitals and nursing note reviewed.  Constitutional:      General: She is not in acute distress.    Appearance: Normal appearance. She is not ill-appearing.  HENT:     Head: Normocephalic.  Eyes:     Extraocular Movements: Extraocular movements intact.     Conjunctiva/sclera: Conjunctivae normal.     Pupils: Pupils are equal, round, and reactive to light.  Cardiovascular:     Rate and Rhythm: Normal rate and regular rhythm.     Pulses: Normal pulses.     Heart sounds: Normal heart sounds. No murmur heard. Pulmonary:     Effort: Pulmonary effort is normal. No respiratory distress.     Breath sounds: Normal breath sounds. No wheezing.  Musculoskeletal:     Cervical back: Normal range of motion.  Skin:    General: Skin is warm.     Comments: See photos below Dryness & flaking bilateral hands Few scattered pustular lesions palmar surfaces  Neurological:     Mental Status: She is alert and oriented to person, place, and time.  Psychiatric:        Mood and Affect: Mood normal.        Behavior: Behavior normal.            UC Treatments / Results  Labs (all labs ordered are listed, but only abnormal results are displayed) Labs Reviewed - No data to display  EKG   Radiology No results found.  Procedures Procedures (including critical care time)  Medications Ordered in UC Medications - No data to display  Initial Impression /  Assessment and Plan /  UC Course  I have reviewed the triage vital signs and the nursing notes.  Pertinent labs & imaging results that were available during my care of the patient were reviewed by me and considered in my medical decision making (see chart for details).     Chronic eczema bilateral hands.  Now with new pustular lesions.  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  Plan to have her follow-up with dermatology.  Will start treatment tonight with doxycycline and Lidex ointment as directed.  Patient understanding and agreeable plan. Final Clinical Impressions(s) / UC Diagnoses   Final diagnoses:  Cellulitis of palm of hand  Eczema of both hands     Discharge Instructions      Very good to meet you today.  You may have an infection of your chronic eczema.  I would like to treat with doxycycline as directed.  You may use the Lidex ointment to help with the pain and dryness of your eczema.  I would like for you to follow-up with the dermatology specialist -please call for an appointment.  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  If worsening symptoms, please follow-up with your PCP or back to the urgent care and we're happy to see you again.    ED Prescriptions     Medication Sig Dispense Auth. Provider   fluocinonide ointment (LIDEX) [id number] % Apply 1 Application topically 2 (two) times daily. 60 g Noha Karasik M, PA-C   doxycycline (VIBRAMYCIN) 100 MG capsule Take 1 capsule (100 mg total) by mouth 2 (two) times daily. 20 capsule Khilee Hendricksen, Randa Evens, PA-C      PDMP not reviewed this encounter.   AllwardtRanda Evens, PA-C 09/08/22 1916
